# Patient Record
Sex: Male | Born: 2000 | Race: Black or African American | Hispanic: No | Marital: Single | Smoking: Never smoker
Health system: Southern US, Community
[De-identification: ages and names within clinical notes are randomized; demographics above are authoritative.]

## PROBLEM LIST (undated history)

## (undated) DIAGNOSIS — R011 Cardiac murmur, unspecified: Secondary | ICD-10-CM

## (undated) HISTORY — DX: Cardiac murmur, unspecified: R01.1

---
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
## 2020-06-01 ENCOUNTER — Emergency Department: Payer: BC Managed Care – PPO

## 2020-06-01 ENCOUNTER — Encounter: Payer: Self-pay | Admitting: Emergency Medicine

## 2020-06-01 ENCOUNTER — Emergency Department
Admission: EM | Admit: 2020-06-01 | Discharge: 2020-06-01 | Disposition: A | Discharge status: Home / Self Care | Payer: BC Managed Care – PPO | Attending: Emergency Medicine | Admitting: Emergency Medicine

## 2020-06-01 ENCOUNTER — Other Ambulatory Visit: Payer: Self-pay

## 2020-06-01 DIAGNOSIS — R0789 Other chest pain: Secondary | ICD-10-CM | POA: Diagnosis not present

## 2020-06-01 DIAGNOSIS — R002 Palpitations: Secondary | ICD-10-CM | POA: Diagnosis not present

## 2020-06-01 DIAGNOSIS — R079 Chest pain, unspecified: Secondary | ICD-10-CM | POA: Diagnosis present

## 2020-06-01 LAB — BASIC METABOLIC PANEL
Anion gap: 8 (ref 5–15)
BUN: 17 mg/dL (ref 6–20)
CO2: 27 mmol/L (ref 22–32)
Calcium: 9.7 mg/dL (ref 8.9–10.3)
Chloride: 101 mmol/L (ref 98–111)
Creatinine, Ser: 0.93 mg/dL (ref 0.61–1.24)
GFR, Estimated: >60 mL/min (ref >60)
Glucose, Bld: 93 mg/dL (ref 70–99)
Potassium: 4.2 mmol/L (ref 3.5–5.1)
Sodium: 136 mmol/L (ref 135–145)

## 2020-06-01 LAB — CBC
HCT: 50.6 % (ref 39.0–52.0)
Hemoglobin: 17.4 g/dL — ABNORMAL HIGH (ref 13.0–17.0)
MCH: 29.4 pg (ref 26.0–34.0)
MCHC: 34.4 g/dL (ref 30.0–36.0)
MCV: 85.6 fL (ref 80.0–100.0)
Platelets: 264 10*3/uL (ref 150–400)
RBC: 5.91 MIL/uL — ABNORMAL HIGH (ref 4.22–5.81)
RDW: 12.4 % (ref 11.5–15.5)
WBC: 7.6 10*3/uL (ref 4.0–10.5)
nRBC: 0 % (ref 0.0–0.2)

## 2020-06-01 LAB — TROPONIN I (HIGH SENSITIVITY): Troponin I (High Sensitivity): 4 ng/L (ref <18)

## 2020-06-01 NOTE — ED Triage Notes (Addendum)
Pt from home via POV. Pt c/o intermittent central CP (sharp) for 2 weeks with no radiation. Denies N/V, dizziness/SHOB.  Nothing makes it better or worse.   Pt st "my heart beats fast". Denies recent illness.  No cardiac hx. No new medication.  NAD noted by this RN

## 2020-06-01 NOTE — ED Provider Notes (Signed)
[facility] Emergency Department Provider Note   ____________________________________________    I have reviewed the triage vital signs and the nursing notes.   HISTORY  Chief Complaint Chest Pain     HPI [name] is a 19 y.o. male who presents with complaints of chest discomfort and palpitations.  Patient reports he has had intermittent symptoms over the last 2 weeks.  He notes he has a history of a ventricular septal defect and he was to follow-up with cardiology on an annual basis but has not seen them in several years.  His mother did schedule an appointment with them for evaluation.  He reports he has had intermittent discomfort in his chest and occasional sense of palpitations/heart beating faster.  Currently feels well and has no complaints.  No calf pain or swelling.  No pleurisy or shortness of breath.  No fevers or chills.  Denies smoking or substance use   History reviewed. No pertinent past medical history.  There are no problems to display for this patient.   History reviewed. No pertinent surgical history.  Prior to Admission medications   Not on File     Allergies Patient has no known allergies.  History reviewed. No pertinent family history.  Social History Social History   Tobacco Use  . Smoking status: Never Smoker  . Smokeless tobacco: Never Used  Vaping Use  . Vaping Use: Never used  Substance Use Topics  . Alcohol use: No  . Drug use: No    Review of Systems  Constitutional: No fever/chills Eyes: No visual changes.  ENT: No sore throat. Cardiovascular: As above Respiratory: Denies shortness of breath. Gastrointestinal: No abdominal pain.  No nausea, no vomiting.   Genitourinary: Negative for dysuria. Musculoskeletal: Negative for back pain. Skin: Negative for rash. Neurological: Negative for headaches or weakness   ____________________________________________   PHYSICAL EXAM:  VITAL  SIGNS: ED Triage Vitals  Enc Vitals Group     BP 06/01/20 1129 140/78     Pulse Rate 06/01/20 1129 (!) 102     Resp 06/01/20 1129 18     Temp 06/01/20 1129 97.9 F (36.6 C)     Temp Source 06/01/20 1129 Oral     SpO2 06/01/20 1129 100 %     Weight 06/01/20 1130 89.8 kg (198 lb)     Height 06/01/20 1130 1.778 m (5\' 10" )     Head Circumference --      Peak Flow --      Pain Score 06/01/20 1129 0     Pain Loc --      Pain Edu? --      Excl. in GC? --     Constitutional: Alert and oriented. No acute distress. Pleasant and interactive  Nose: No congestion/rhinnorhea. Mouth/Throat: Mucous membranes are moist.    Cardiovascular: Normal rate, regular rhythm.  3 out of 6 systolic murmur, good peripheral circulation. Respiratory: Normal respiratory effort.  No retractions. Lungs CTAB. Gastrointestinal: Soft and nontender. No distention.  No CVA tenderness. Genitourinary: deferred Musculoskeletal: No lower extremity tenderness nor edema.  Warm and well perfused Neurologic:  Normal speech and language. No gross focal neurologic deficits are appreciated.  Skin:  Skin is warm, dry and intact. No rash noted. Psychiatric: Mood and affect are normal. Speech and behavior are normal.  ____________________________________________   LABS (all labs ordered are listed, but only abnormal results are displayed)  Labs Reviewed  CBC - Abnormal; Notable for the following components:  Result Value   RBC 5.91 (*)    Hemoglobin 17.4 (*)    All other components within normal limits  BASIC METABOLIC PANEL  TROPONIN I (HIGH SENSITIVITY)  TROPONIN I (HIGH SENSITIVITY)   ____________________________________________  EKG  ED ECG REPORT I, Jene Every, the attending physician, personally viewed and interpreted this ECG.  Date: 06/01/2020  Rhythm: normal sinus rhythm QRS Axis: normal Intervals: normal ST/T Wave abnormalities: normal Narrative Interpretation: no evidence of acute  ischemia  ____________________________________________  RADIOLOGY  Chest x-ray viewed by me, no infiltrate or effusion ____________________________________________   PROCEDURES  Procedure(s) performed: No  Procedures   Critical Care performed: No ____________________________________________   INITIAL IMPRESSION / ASSESSMENT AND PLAN / ED COURSE  Pertinent labs & imaging results that were available during my care of the patient were reviewed by me and considered in my medical decision making (see chart for details).  Patient presents with palpitations, chest discomfort, asymptomatic at this time.  Heart rate is reassuring, EKG is normal.  Lab work demonstrates normal cardiac enzymes and chemistries.  Mild elevation of hemoglobin noted, no old labs to compare to.  No signs of infection, no evidence of arrhythmia on EKG.  Not consistent with myocarditis given normal troponin.  Given the patient is asymptomatic here in the emergency department and has cardiology follow-up arranged, appropriate for discharge at this time, strict precautions discussed    ____________________________________________   FINAL CLINICAL IMPRESSION(S) / ED DIAGNOSES  Final diagnoses:  Atypical chest pain        Note:  This document was prepared using Dragon voice recognition software and may include unintentional dictation errors.   Jene Every, MD 06/01/20 [phone]

## 2020-07-26 ENCOUNTER — Other Ambulatory Visit: Payer: Self-pay

## 2020-07-26 ENCOUNTER — Emergency Department
Admission: EM | Admit: 2020-07-26 | Discharge: 2020-07-26 | Disposition: A | Discharge status: Home / Self Care | Payer: BC Managed Care – PPO | Attending: Emergency Medicine | Admitting: Emergency Medicine

## 2020-07-26 ENCOUNTER — Emergency Department: Payer: BC Managed Care – PPO

## 2020-07-26 ENCOUNTER — Encounter: Payer: Self-pay | Admitting: Emergency Medicine

## 2020-07-26 DIAGNOSIS — R079 Chest pain, unspecified: Secondary | ICD-10-CM | POA: Diagnosis present

## 2020-07-26 DIAGNOSIS — M940 Chondrocostal junction syndrome [Tietze]: Secondary | ICD-10-CM | POA: Diagnosis not present

## 2020-07-26 DIAGNOSIS — Z20822 Contact with and (suspected) exposure to covid-19: Secondary | ICD-10-CM | POA: Diagnosis not present

## 2020-07-26 LAB — RESP PANEL BY RT-PCR (FLU A&B, COVID) ARPGX2
Influenza A by PCR: NEGATIVE
Influenza B by PCR: NEGATIVE
SARS Coronavirus 2 by RT PCR: NEGATIVE

## 2020-07-26 MED ORDER — MELOXICAM 15 MG PO TABS: 15.0000 mg | ORAL_TABLET | Freq: Every day | ORAL | 0 refills | Status: DC

## 2020-07-26 NOTE — ED Notes (Signed)
Pt states he has intermittent pain in upper chest, states it is not brought on by exertion. Pt states he has some SOB at times, pt in bed at present, NAD. Pt denies cp and SOB at present.

## 2020-07-26 NOTE — ED Provider Notes (Signed)
[facility] Emergency Department Provider Note  ____________________________________________  Time seen: Approximately 5:00 PM  I have reviewed the triage vital signs and the nursing notes.   HISTORY  Chief Complaint Chest Pain and Shortness of Breath    HPI [name] is a 19 y.o. male who presents the emergency department complaining of intermittent left-sided chest pain.  Patient states that the pain began several months ago.  He was seen here at the end of October, on October 29 for similar symptoms.  At that time he had a reassuring work-up with no acute findings.  Patient states that he is having ongoing symptoms that have not worsened nor have they improved.  Patient is primarily concerned as he does have a history  of a VSD and follows with cardiology every 3 months.  Patient has noticed no palpitations, front difficulty of breathing, radiation of pain.  Patient describes it as a intermittent sharp sensation.  He is currently not symptomatic.  Pain is reproduced with palpation along the left sternal border.  Patient states that when he does feel the pain Tylenol typically alleviates his symptoms.        History reviewed. No pertinent past medical history.  There are no problems to display for this patient.   History reviewed. No pertinent surgical history.  Prior to Admission medications   Medication Sig Start Date End Date Taking? Authorizing Provider  meloxicam (MOBIC) 15 MG tablet Take 1 tablet (15 mg total) by mouth daily. 07/26/20   Quantavis Obryant, Delorise Royals, PA-C    Allergies Patient has no known allergies.  No family history on file.  Social History Social History   Tobacco Use   Smoking status: Never Smoker   Smokeless tobacco: Never Used  Building services engineer Use: Never used  Substance Use Topics   Alcohol use: No   Drug use: No     Review of Systems  Constitutional: No fever/chills Eyes: No visual changes. No  discharge ENT: No upper respiratory complaints. Cardiovascular: no chest pain. Respiratory: no cough. No SOB. Gastrointestinal: No abdominal pain.  No nausea, no vomiting.  No diarrhea.  No constipation. Musculoskeletal: Negative for musculoskeletal pain. Skin: Negative for rash, abrasions, lacerations, ecchymosis. Neurological: Negative for headaches, focal weakness or numbness.  10 System ROS otherwise negative.  ____________________________________________   PHYSICAL EXAM:  VITAL SIGNS: ED Triage Vitals  Enc Vitals Group     BP 07/26/20 1350 137/89     Pulse Rate 07/26/20 1350 88     Resp 07/26/20 1350 20     Temp --      Temp src --      SpO2 07/26/20 1350 96 %     Weight 07/26/20 1028 198 lb (89.8 kg)     Height 07/26/20 1028 5\' 10"  (1.778 m)     Head Circumference --      Peak Flow --      Pain Score 07/26/20 1028 2     Pain Loc --      Pain Edu? --      Excl. in GC? --      Constitutional: Alert and oriented. Well appearing and in no acute distress. Eyes: Conjunctivae are normal. PERRL. EOMI. Head: Atraumatic. ENT:      Ears:       Nose: No congestion/rhinnorhea.      Mouth/Throat: Mucous membranes are moist.  Neck: No stridor.    Cardiovascular: Normal rate, regular rhythm. Normal S1 and S2.  Systolic murmur noted throughout auscultation.  Good peripheral circulation. Respiratory: Normal respiratory effort without tachypnea or retractions. Lungs CTAB. Good air entry to the bases with no decreased or absent breath sounds. Musculoskeletal: Full range of motion to all extremities. No gross deformities appreciated. Neurologic:  Normal speech and language. No gross focal neurologic deficits are appreciated.  Skin:  Skin is warm, dry and intact. No rash noted. Psychiatric: Mood and affect are normal. Speech and behavior are normal. Patient exhibits appropriate insight and judgement.   ____________________________________________   LABS (all labs ordered are  listed, but only abnormal results are displayed)  Labs Reviewed  RESP PANEL BY RT-PCR (FLU A&B, COVID) [id number]   ____________________________________________  EKG  ED ECG REPORT I, Delorise Royals Tommy Goostree,  personally viewed and interpreted this ECG.   Date: 07/26/2020  EKG Time: 1023 hrs.  Rate: 76 bpm  Rhythm: unchanged from previous tracings, normal sinus rhythm, incomplete right bundle branch block  Axis: Normal axis  Intervals:Incomplete right bundle branch block  ST&T Change: No ST elevation or depression noted  Normal sinus rhythm.  Incomplete right bundle branch block.  When compared from previous EKG on 06/01/2020 no significant changes.  No STEMI.  ____________________________________________  RADIOLOGY I personally viewed and evaluated these images as part of my medical decision making, as well as reviewing the written report by the radiologist.  ED Provider Interpretation: No acute cardiopulmonary abnormality  DG Chest 2 View  Result Date: 07/26/2020 CLINICAL DATA:  Chest pain and shortness of breath EXAM: CHEST - 2 VIEW COMPARISON:  06/01/2020 FINDINGS: The heart size and mediastinal contours are within normal limits. Both lungs are clear. The visualized skeletal structures are unremarkable. IMPRESSION: No active cardiopulmonary disease. Electronically Signed   By: Alcide Clever M.D.   On: 07/26/2020 10:52    ____________________________________________    PROCEDURES  Procedure(s) performed:    Procedures    Medications - No data to display   ____________________________________________   INITIAL IMPRESSION / ASSESSMENT AND PLAN / ED COURSE  Pertinent labs & imaging results that were available during my care of the patient were reviewed by me and considered in my medical decision making (see chart for details).  Review of the [place] CSRS was performed in accordance of the [facility] prior to dispensing any controlled drugs.           Patient's diagnosis  is consistent with costochondritis.  Patient presented to the emergency department with intermittent sharp left chest wall/chest pain x3 months.  Patient states that he been seen in this department previously, on 1020 04/2020 for similar complaints.  At that time his work-up was reassuring.  Patient has had no change in his symptoms other than it has persisted.  He states that it is very intermittent in nature.  On exam the patient does have reproducible pain along the left sternal border.  He states that the pain reproduced is the pain that he has been feeling.  He does have a systolic heart murmur identified consistent with his known VSD.  Patient is scheduled to see his cardiologist in 2 months.  He sees his cardiologist every 3 months.  At this time, chest x-ray, EKG is reassuring.  I have discussed at length labs including BMP, CBC and troponin as well as further imaging such as CT scan to evaluate the patient's symptoms.  At this time the patient and his mother would prefer treatment for costochondritis and discharge given the length of time he spent in the emergency  department.  At this time even though patient does have a history of VSD I have a low suspicion for pericarditis, endocarditis, PE, STEMI/ACS, dissection.  Patient's symptoms are reproduced with palpation along the left sternal border consistent with costochondritis.  I will place the patient on an anti-inflammatory.  Strict return precautions have been discussed with the patient and his mother for any new or worsening symptoms.  Patient is to follow-up with his cardiologist in 2 months. Patient is given ED precautions to return to the ED for any worsening or new symptoms.     ____________________________________________  FINAL CLINICAL IMPRESSION(S) / ED DIAGNOSES  Final diagnoses:  Costochondritis      NEW MEDICATIONS STARTED DURING THIS VISIT:  ED Discharge Orders         Ordered    meloxicam (MOBIC) 15 MG tablet  Daily         07/26/20 1749              This chart was dictated using voice recognition software/Dragon. Despite best efforts to proofread, errors can occur which can change the meaning. Any change was purely unintentional.    Racheal Patches, PA-C 07/26/20 1749    Phineas Semen, MD 07/26/20 [phone]

## 2020-07-26 NOTE — ED Triage Notes (Signed)
Pt reports intermittent sharp chest pain that started a couple of months ago and SOB that started yesterday. Pt denies all other sx's or recent illnesses.

## 2020-10-28 ENCOUNTER — Encounter: Payer: Self-pay | Admitting: Emergency Medicine

## 2020-10-28 ENCOUNTER — Emergency Department: Payer: BC Managed Care – PPO

## 2020-10-28 ENCOUNTER — Other Ambulatory Visit: Payer: Self-pay

## 2020-10-28 ENCOUNTER — Emergency Department
Admission: EM | Admit: 2020-10-28 | Discharge: 2020-10-28 | Disposition: A | Discharge status: Home / Self Care | Payer: BC Managed Care – PPO | Attending: Emergency Medicine | Admitting: Emergency Medicine

## 2020-10-28 DIAGNOSIS — R42 Dizziness and giddiness: Secondary | ICD-10-CM

## 2020-10-28 DIAGNOSIS — Y92410 Unspecified street and highway as the place of occurrence of the external cause: Secondary | ICD-10-CM | POA: Insufficient documentation

## 2020-10-28 DIAGNOSIS — G44311 Acute post-traumatic headache, intractable: Secondary | ICD-10-CM | POA: Insufficient documentation

## 2020-10-28 DIAGNOSIS — R519 Headache, unspecified: Secondary | ICD-10-CM | POA: Diagnosis present
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
## 2020-12-31 ENCOUNTER — Encounter: Payer: Self-pay | Admitting: Emergency Medicine

## 2020-12-31 ENCOUNTER — Emergency Department
Admission: EM | Admit: 2020-12-31 | Discharge: 2020-12-31 | Disposition: A | Discharge status: Home / Self Care | Payer: No Typology Code available for payment source | Attending: Emergency Medicine | Admitting: Emergency Medicine

## 2020-12-31 DIAGNOSIS — Y92511 Restaurant or cafe as the place of occurrence of the external cause: Secondary | ICD-10-CM | POA: Insufficient documentation

## 2020-12-31 DIAGNOSIS — X118XXA Contact with other hot tap-water, initial encounter: Secondary | ICD-10-CM | POA: Diagnosis not present

## 2020-12-31 DIAGNOSIS — T23001A Burn of unspecified degree of right hand, unspecified site, initial encounter: Secondary | ICD-10-CM | POA: Diagnosis present

## 2020-12-31 DIAGNOSIS — T23201A Burn of second degree of right hand, unspecified site, initial encounter: Secondary | ICD-10-CM | POA: Diagnosis not present

## 2020-12-31 DIAGNOSIS — Y990 Civilian activity done for income or pay: Secondary | ICD-10-CM | POA: Diagnosis not present

## 2020-12-31 DIAGNOSIS — T23261A Burn of second degree of back of right hand, initial encounter: Secondary | ICD-10-CM

## 2020-12-31 MED ORDER — SILVER SULFADIAZINE 1 % EX CREA: TOPICAL_CREAM | CUTANEOUS | 0 refills | Status: DC

## 2020-12-31 MED ORDER — SILVER SULFADIAZINE 1 % EX CREA: TOPICAL_CREAM | Freq: Once | CUTANEOUS | Status: AC

## 2020-12-31 MED ORDER — TRAMADOL HCL 50 MG PO TABS: 50.0000 mg | ORAL_TABLET | Freq: Three times a day (TID) | ORAL | 0 refills | Status: AC | PRN

## 2020-12-31 NOTE — ED Notes (Signed)
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

## 2020-12-31 NOTE — ED Provider Notes (Signed)
[facility] Emergency Department Provider Note ____________________________________________  Time seen: 2121  I have reviewed the triage vital signs and the nursing notes.  HISTORY  Chief Complaint  Hand Burn  HPI [name] is a 20 y.o. male has been sent to the ED for evaluation of a work-related injury.  Patient presents with a burn to the dorsum of his right hand.  Patient works at a [employer]	[place], and got splashed on the hand by some boiling pasta water.   He presents with intact blisters to the dorsum of the right hand with surrounding erythema.  He denies any other injury at this time.  History reviewed. No pertinent past medical history.  There are no problems to display for this patient.   History reviewed. No pertinent surgical history.  Prior to Admission medications   Medication Sig Start Date End Date Taking? Authorizing Provider  silver sulfADIAZINE (SILVADENE) 1 % cream Apply to affected area daily 12/31/20  Yes Jabron Weese, Charlesetta Ivory, PA-C  traMADol (ULTRAM) 50 MG tablet Take 1 tablet (50 mg total) by mouth 3 (three) times daily as needed for up to 5 days. 12/31/20 01/05/21 Yes Josefina Rynders, Charlesetta Ivory, PA-C    Allergies Patient has no known allergies.  History reviewed. No pertinent family history.  Social History Social History   Tobacco Use  . Smoking status: Never Smoker  . Smokeless tobacco: Never Used  Vaping Use  . Vaping Use: Never used  Substance Use Topics  . Alcohol use: No  . Drug use: No    Review of Systems  Constitutional: Negative for fever. Cardiovascular: Negative for chest pain. Respiratory: Negative for shortness of breath. Gastrointestinal: Negative for abdominal pain, vomiting and diarrhea. Genitourinary: Negative for dysuria. Musculoskeletal: Negative for back pain. Skin: Negative for rash.  Right hand burn as above. Neurological: Negative for headaches, focal weakness or  numbness. ____________________________________________  PHYSICAL EXAM:  VITAL SIGNS: ED Triage Vitals  Enc Vitals Group     BP 12/31/20 1952 (!) 156/100     Pulse Rate 12/31/20 1952 91     Resp 12/31/20 1952 18     Temp 12/31/20 1952 98.4 F (36.9 C)     Temp Source 12/31/20 1952 Oral     SpO2 12/31/20 1952 98 %     Weight 12/31/20 1952 210 lb (95.3 kg)     Height --      Head Circumference --      Peak Flow --      Pain Score 12/31/20 2028 8     Pain Loc --      Pain Edu? --      Excl. in GC? --     Constitutional: Alert and oriented. Well appearing and in no distress. Head: Normocephalic and atraumatic. Eyes: Conjunctivae are normal. Normal extraocular movements Cardiovascular: Normal rate, regular rhythm. Normal distal pulses. Respiratory: Normal respiratory effort. Musculoskeletal: Normal composite fist on the right.  Nontender with normal range of motion in all extremities.  Neurologic:  Normal gait without ataxia. Normal speech and language. No gross focal neurologic deficits are appreciated. Skin:  Skin is warm, dry and intact, except for dorsal second-degree burn with intact blisters distally, and a disrupted blister approximately. No rash noted. ____________________________________________  PROCEDURES  Silvadene ointment Wound care  Procedures ____________________________________________   INITIAL IMPRESSION / ASSESSMENT AND PLAN / ED COURSE  As part of my medical decision making, I reviewed the following data within the electronic MEDICAL RECORD NUMBER  Notes from prior ED visits and [place] Controlled Substance Database   Patient ED evaluation management of a work-related injury resulting in a second-degree burn to the dorsum of the right hand.  Patient was evaluated for his complaints, and treated for the second-degree burn with application of Silvadene ointment.  Wound care supplies are provided.  Patient discharged with a prescription for Silvadene ointment as well  as a tramadol prescription for pain relief.  He is return to work with right hand use as tolerated, keeping the wound/dressing clean and dry.  Follow-up with [facility] for ongoing management.  [name] was evaluated in Emergency Department on 12/31/2020 for the symptoms described in the history of present illness. He was evaluated in the context of the global COVID-19 pandemic, which necessitated consideration that the patient might be at risk for infection with the SARS-CoV-2 virus that causes COVID-19. Institutional protocols and algorithms that pertain to the evaluation of patients at risk for COVID-19 are in a state of rapid change based on information released by regulatory bodies including the CDC and federal and state organizations. These policies and algorithms were followed during the patient's care in the ED.  I reviewed the patient's prescription history over the last 12 months in the multi-state controlled substances database(s) that includes [place], [place], [place], [place], [place], [place], [place], [place], [place], [place], [place], [place], [place], and [place].  Results were notable for no RX history. ____________________________________________  FINAL CLINICAL IMPRESSION(S) / ED DIAGNOSES  Final diagnoses:  Partial thickness burn of back of right hand, initial encounter      Lissa Hoard, PA-C 12/31/20 2151    Gilles Chiquito, MD 12/31/20 2314

## 2020-12-31 NOTE — ED Triage Notes (Signed)
Pt at work when another employee spilled boiling water onto his right hand/wrist at approx 1840. Pt has noted blisters and redness to the area. Pain reported by pt in area.

## 2020-12-31 NOTE — Discharge Instructions (Signed)
Apply the antibiotic ointment daily with wound dressing changes.  Keep the wound and dressing clean and dry.  Follow-up with your primary provider or [facility], for further wound care.  Return to the ED if needed.

## 2021-01-04 ENCOUNTER — Encounter: Payer: No Typology Code available for payment source | Attending: Physician Assistant | Admitting: Physician Assistant

## 2021-01-04 ENCOUNTER — Other Ambulatory Visit: Payer: Self-pay

## 2021-01-04 DIAGNOSIS — T23261A Burn of second degree of back of right hand, initial encounter: Secondary | ICD-10-CM | POA: Diagnosis not present

## 2021-01-04 DIAGNOSIS — X12XXXA Contact with other hot fluids, initial encounter: Secondary | ICD-10-CM | POA: Diagnosis not present

## 2021-01-04 DIAGNOSIS — Y939 Activity, unspecified: Secondary | ICD-10-CM | POA: Insufficient documentation

## 2021-01-04 DIAGNOSIS — Y990 Civilian activity done for income or pay: Secondary | ICD-10-CM | POA: Insufficient documentation

## 2021-01-04 DIAGNOSIS — Y92511 Restaurant or cafe as the place of occurrence of the external cause: Secondary | ICD-10-CM | POA: Insufficient documentation

## 2021-01-04 NOTE — Progress Notes (Signed)
[name], [name] ([id number]) Visit Report for 01/04/2021 Abuse/Suicide Risk Screen Details Patient Name: [name], [name] Date of Service: 01/04/2021 2:15 PM Medical Record Number: [id number] Patient Account Number: [id number] Date of Birth/Sex: [birth year] (20 y.o. M) Treating RN: Rogers Blocker Primary Care Truth Barot: Marcos Eke Other Clinician: Referring Amrita Radu: Antoine Primas Treating Rozell Kettlewell/Extender: Rowan Blase in Treatment: 0 Abuse/Suicide Risk Screen Items Answer ABUSE RISK SCREEN: Has anyone close to you tried to hurt or harm you recentlyo No Do you feel uncomfortable with anyone in your familyo No Has anyone forced you do things that you didnot want to doo No Electronic Signature(s) Signed: 01/04/2021 4:18:56 PM By: Phillis Haggis, Dondra Prader RN Entered By: Phillis Haggis, Dondra Prader on 01/04/2021 14:40:47 [name], William D. ([id number]) -------------------------------------------------------------------------------- Activities of Daily Living Details Patient Name: [name], William D. Date of Service: 01/04/2021 2:15 PM Medical Record Number: [id number] Patient Account Number: [id number] Date of Birth/Sex: 16-Mar-2001 (20 y.o. M) Treating RN: Rogers Blocker Primary Care Kelci Petrella: Marcos Eke Other Clinician: Referring Yarissa Reining: Antoine Primas Treating Eloy Fehl/Extender: Rowan Blase in Treatment: 0 Activities of Daily Living Items Answer Activities of Daily Living (Please select one for each item) Drive Automobile Completely Able Take Medications Completely Able Use Telephone Completely Able Care for Appearance Completely Able Use Toilet Completely Able Bath / Shower Completely Able Dress Self Completely Able Feed Self Completely Able Walk Completely Able Get In / Out Bed Completely Able Housework Completely Able Prepare Meals Completely Able Handle Money Completely Able Shop for Self Completely Able Electronic Signature(s) Signed:  01/04/2021 4:18:56 PM By: Phillis Haggis, Dondra Prader RN Entered By: Phillis Haggis, Dondra Prader on 01/04/2021 14:41:04 [name], [name] ([id number]) -------------------------------------------------------------------------------- Education Screening Details Patient Name: William Arnold D. Date of Service: 01/04/2021 2:15 PM Medical Record Number: [id number] Patient Account Number: [id number] Date of Birth/Sex: 25-Mar-2001 (20 y.o. M) Treating RN: Rogers Blocker Primary Care Skylen Danielsen: Marcos Eke Other Clinician: Referring Tatym Schermer: Antoine Primas Treating Anthea Udovich/Extender: Rowan Blase in Treatment: 0 Primary Learner Assessed: Patient Learning Preferences/Education Level/Primary Language Learning Preference: Explanation, Demonstration Highest Education Level: High School Preferred Language: English Cognitive Barrier Language Barrier: No Translator Needed: No Memory Deficit: No Emotional Barrier: No Cultural/Religious Beliefs Affecting Medical Care: No Physical Barrier Impaired Vision: No Impaired Hearing: No Decreased Hand dexterity: No Knowledge/Comprehension Knowledge Level: High Comprehension Level: High Ability to understand written instructions: High Ability to understand verbal instructions: High Motivation Anxiety Level: Calm Cooperation: Cooperative Education Importance: Acknowledges Need Interest in Health Problems: Asks Questions Perception: Coherent Willingness to Engage in Self-Management High Activities: Readiness to Engage in Self-Management High Activities: Electronic Signature(s) Signed: 01/04/2021 4:18:56 PM By: Phillis Haggis, Dondra Prader RN Entered By: Phillis Haggis, Dondra Prader on 01/04/2021 14:41:29 [name], [name] ([id number]) -------------------------------------------------------------------------------- Fall Risk Assessment Details Patient Name: William Arnold D. Date of Service: 01/04/2021 2:15 PM Medical Record Number:  [id number] Patient Account Number: [id number] Date of Birth/Sex: 2001-03-08 (20 y.o. M) Treating RN: Rogers Blocker Primary Care Blakeley Scheier: DOWNS, Jeannett Senior Other Clinician: Referring Dianara Smullen: Antoine Primas Treating Erza Mothershead/Extender: Rowan Blase in Treatment: 0 Fall Risk Assessment Items Have you had 2 or more falls in the last 12 monthso 0 No Have you had any fall that resulted in injury in the last 12 monthso 0 No FALLS RISK SCREEN History of falling - immediate or within 3 months 0 No Secondary diagnosis (Do you have 2 or more medical diagnoseso) 0 No Ambulatory aid None/bed rest/wheelchair/nurse 0 No Crutches/cane/walker 0 No Furniture 0 No Intravenous therapy Access/Saline/Heparin Lock 0 No Gait/Transferring Normal/ bed rest/ wheelchair  0 Yes Weak (short steps with or without shuffle, stooped but able to lift head while walking, may 0 No seek support from furniture) Impaired (short steps with shuffle, may have difficulty arising from chair, head down, impaired 0 No balance) Mental Status Oriented to own ability 0 Yes Electronic Signature(s) Signed: 01/04/2021 4:18:56 PM By: Phillis Haggis, Dondra Prader RN Entered By: Phillis Haggis, Kenia on 01/04/2021 14:41:43 [name], William D. ([id number]) -------------------------------------------------------------------------------- Foot Assessment Details Patient Name: [name], William D. Date of Service: 01/04/2021 2:15 PM Medical Record Number: [id number] Patient Account Number: [id number] Date of Birth/Sex: 2001/05/24 (20 y.o. M) Treating RN: Rogers Blocker Primary Care Dori Devino: DOWNS, Jeannett Senior Other Clinician: Referring Necha Harries: Antoine Primas Treating Kemiyah Tarazon/Extender: Rowan Blase in Treatment: 0 Foot Assessment Items Site Locations + = Sensation present, - = Sensation absent, C = Callus, U = Ulcer R = Redness, W = Warmth, M = Maceration, PU = Pre-ulcerative lesion F = Fissure, S = Swelling, D =  Dryness Assessment Right: Left: Other Deformity: No No Prior Foot Ulcer: No No Prior Amputation: No No Charcot Joint: No No Ambulatory Status: Ambulatory Without Help Gait: Steady Electronic Signature(s) Signed: 01/04/2021 4:18:56 PM By: Phillis Haggis, Dondra Prader RN Entered By: Phillis Haggis, Dondra Prader on 01/04/2021 14:41:57 [name], William D. ([id number]) -------------------------------------------------------------------------------- Nutrition Risk Screening Details Patient Name: [name], William D. Date of Service: 01/04/2021 2:15 PM Medical Record Number: [id number] Patient Account Number: [id number] Date of Birth/Sex: 2000/08/15 (20 y.o. M) Treating RN: Rogers Blocker Primary Care Jackelin Correia: Marcos Eke Other Clinician: Referring Khaliel Morey: Antoine Primas Treating Camillo Quadros/Extender: Allen Derry Weeks in Treatment: 0 Height (in): 70 Weight (lbs): 218 Body Mass Index (BMI): 31.3 Nutrition Risk Screening Items Score Screening NUTRITION RISK SCREEN: I have an illness or condition that made me change the kind and/or amount of food I eat 0 No I eat fewer than two meals per day 0 No I eat few fruits and vegetables, or milk products 0 No I have three or more drinks of beer, liquor or wine almost every day 0 No I have tooth or mouth problems that make it hard for me to eat 0 No I don't always have enough money to buy the food I need 0 No I eat alone most of the time 0 No I take three or more different prescribed or over-the-counter drugs a day 0 No Without wanting to, I have lost or gained 10 pounds in the last six months 0 No I am not always physically able to shop, cook and/or feed myself 0 No Nutrition Protocols Good Risk Protocol 0 No interventions needed Moderate Risk Protocol High Risk Proctocol Risk Level: Good Risk Score: 0 Electronic Signature(s) Signed: 01/04/2021 4:18:56 PM By: Phillis Haggis, Dondra Prader RN Entered By: Phillis Haggis, Dondra Prader on 01/04/2021 14:41:50

## 2021-01-08 NOTE — Progress Notes (Signed)
[name], William D. ([id number]) Visit Report for 01/04/2021 Chief Complaint Document Details Patient Name: [name], William D. Date of Service: 01/04/2021 2:15 PM Medical Record Number: [id number] Patient Account Number: [id number] Date of Birth/Sex: 05/08/2001 (20 y.o. M) Treating RN: [name] Primary Care Provider: Marcos EkeWNS, [place] Other Clinician: Referring Provider: Antoine PrimasSmith, [place] Treating Provider/Extender: [name]: 0 Information Obtained from: Patient Chief Complaint Second Degree burn right hand Electronic Signature(s) Signed: 01/04/2021 3:13:19 PM By: William KelpStone III, Lyric Hoar PA-C Entered By: William KelpStone III, Ilda Laskin on 01/04/2021 15:13:19 [name] Kitchen. ([id number]) -------------------------------------------------------------------------------- HPI Details Patient Name: [name], William D. Date of Service: 01/04/2021 2:15 PM Medical Record Number: [id number] Patient Account Number: [id number] Date of Birth/Sex: 02/09/2001 (20 y.o. M) Treating RN: [name] Primary Care Provider: Marcos EkeWNS, [place] Other Clinician: Referring Provider: Antoine PrimasSmith, [place] Treating Provider/Extender: William BlaseStone, Domingue Coltrain Weeks in [place]: 0 History of Present Illness HPI Description: 01/04/2021 upon evaluation today patient presents for initial inspection here in our clinic concerning a burn which is second- degree over his right dorsal hand which occurred this past Monday, 4 days ago, around 5:30 in the evening. 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 Hot water boiled over and splashed on his hand. He has not really been able to work since that time. With that being said he does not show any signs of active infection at this time which is great news has been using Silvadene which is what the ER recommended when he was seen there. He tells me that does help it feel somewhat better. Fortunately there does not  appear to be any signs of infection right now the Silvadene should help in this regard. Electronic Signature(s) Signed: 01/04/2021 5:22:12 PM By: William KelpStone III, Theodoros Stjames PA-C Entered By: William KelpStone III, Garo Heidelberg on 01/04/2021 17:22:12 [name], [name] ([id number]) -------------------------------------------------------------------------------- Physical Exam Details Patient Name: [name], William D. Date of Service: 01/04/2021 2:15 PM Medical Record Number: [id number] Patient Account Number: [id number] Date of Birth/Sex: 04/10/2001 (20 y.o. M) Treating RN: [name] Primary Care Provider: Marcos EkeWNS, [place] Other Clinician: Referring Provider: Antoine PrimasSmith, [place] Treating Provider/Extender: William BlaseStone, Tawsha Terrero Weeks in [place]: 0 Constitutional sitting or standing blood pressure is within target range for patient.. pulse regular and within target range for patient.[place] Kitchen. respirations regular, non- labored and within target range for patient.[place] Kitchen. temperature within target range for patient.. Well-nourished and well-hydrated in no acute distress. Eyes conjunctiva clear no eyelid edema noted. pupils equal round and reactive to light and accommodation. Ears, Nose, Mouth, and Throat no gross abnormality of ear auricles or external auditory canals. normal hearing noted during conversation. mucus membranes moist. Respiratory normal breathing without difficulty. Musculoskeletal normal gait and posture. no significant deformity or arthritic changes, no loss or range of motion, no clubbing. Psychiatric this patient is able to make decisions and demonstrates good insight into disease process. Alert and Oriented x 3. pleasant and cooperative. Notes Upon inspection patient's wound bed actually showed signs of a fairly superficial burn this is definitely a second-degree burn however as it does seem that the tissue blistered and I did clear away some of the tissue by way of mechanical debridement today which was not healthy  and necrotic on the surface. He tolerated that without significant pain although he did have some discomfort this is good news. Overall I think that he is doing quite well and I really feel like he is in a good spot to see this heal quite  effectively and quickly. With the appropriate therapy. Electronic Signature(s) Signed: 01/04/2021 5:22:46 PM By: William Kelp PA-C Entered By: William Kelp on 01/04/2021 17:22:45 [name], [name] ([id number]) -------------------------------------------------------------------------------- Physician Orders Details Patient Name: William Arnold D. Date of Service: 01/04/2021 2:15 PM Medical Record Number: [id number] Patient Account Number: [id number] Date of Birth/Sex: 04-Nov-2000 (20 y.o. M) Treating RN: William Pax Primary Care Provider: DOWNS, Jeannett [place] Other Clinician: Referring Provider: Antoine Primas Treating Provider/Extender: William Blase in [place]: 0 Verbal / Phone Orders: No Diagnosis Coding ICD-10 Coding Code Description T23.261A Burn of second degree of back of right hand, initial encounter Follow-up Appointments o Return Appointment in 1 week. Wound [place] Wound #1 - Hand - Dorsum Wound Laterality: Right Cleanser: Soap and Water 2 x Per Day/30 Days Discharge Instructions: Gently cleanse wound with antibacterial soap, rinse and pat dry prior to dressing wounds Primary Dressing: silvadene 2 x Per Day/30 Days Discharge Instructions: apply thick layer to wound bed Secondary Dressing: ABD Pad 5x9 (in/in) 2 x Per Day/30 Days Discharge Instructions: Cover with ABD pad Secondary Dressing: Kerlix 4.5 x 4.1 (in/yd) 2 x Per Day/30 Days Discharge Instructions: Apply Kerlix 4.5 x 4.1 (in/yd) as instructed Secured With: 27M Medipore H Soft Cloth Surgical Tape, 2x2 (in/yd) 2 x Per Day/30 Days Electronic Signature(s) Signed: 01/04/2021 5:30:36 PM By: William Kelp PA-C Signed: 01/08/2021 8:01:20 AM By: William Pax RN Entered By:  William Pax on 01/04/2021 15:44:09 Lablanc, Wynn D. ([id number]) -------------------------------------------------------------------------------- Problem List Details Patient Name: [name], William D. Date of Service: 01/04/2021 2:15 PM Medical Record Number: [id number] Patient Account Number: [id number] Date of Birth/Sex: 2000-08-12 (20 y.o. M) Treating RN: William Pax Primary Care Provider: Marcos Eke Other Clinician: Referring Provider: Antoine Primas Treating Provider/Extender: William Blase in [place]: 0 Active Problems ICD-10 Encounter Code Description Active Date MDM Diagnosis T23.261A Burn of second degree of back of right hand, initial encounter 01/04/2021 No Yes Inactive Problems Resolved Problems Electronic Signature(s) Signed: 01/04/2021 3:12:46 PM By: William Kelp PA-C Entered By: William Kelp on 01/04/2021 15:12:46 [name], William D. ([id number]) -------------------------------------------------------------------------------- Progress Note Details Patient Name: [name], William D. Date of Service: 01/04/2021 2:15 PM Medical Record Number: [id number] Patient Account Number: [id number] Date of Birth/Sex: [birth year] (20 y.o. M) Treating RN: William Pax Primary Care Provider: Marcos Eke Other Clinician: Referring Provider: Antoine Primas Treating Provider/Extender: William Blase in [place]: 0 Subjective Chief Complaint Information obtained from Patient Second Degree burn right hand History of Present Illness (HPI) 01/04/2021 upon evaluation today patient presents for initial inspection here in our clinic concerning a burn which is second-degree over his right dorsal hand which occurred this past Monday, 4 days ago, around 5:30 in the evening. 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 Hot water boiled over and splashed on his hand. He has not really been  able to work since that time. With that being said he does not show any signs of active infection at this time which is great news has been using Silvadene which is what the ER recommended when he was seen there. He tells me that does help it feel somewhat better. Fortunately there does not appear to be any signs of infection right now the Silvadene should help in this regard. Patient History Allergies No Known Drug Allergies Social History Never smoker, Marital Status - Single, Alcohol Use - Never, Drug Use - No History, Caffeine Use -  Rarely. Medical History Eyes Denies history of Cataracts, Glaucoma, Optic Neuritis Ear/Nose/Mouth/Throat Denies history of Chronic sinus problems/congestion, Middle ear problems Hematologic/Lymphatic Denies history of Anemia, Hemophilia, Human Immunodeficiency Virus, Lymphedema, Sickle Cell Disease Respiratory Denies history of Aspiration, Asthma, Chronic Obstructive Pulmonary Disease (COPD), Pneumothorax, Sleep Apnea, Tuberculosis Cardiovascular Denies history of Angina, Arrhythmia, Congestive Heart Failure, Coronary Artery Disease, Deep Vein Thrombosis, Hypertension, Hypotension, Myocardial Infarction, Peripheral Arterial Disease, Peripheral Venous Disease, Phlebitis, Vasculitis Gastrointestinal Denies history of Cirrhosis , Colitis, Crohn s, Hepatitis A, Hepatitis B, Hepatitis C Endocrine Denies history of Type I Diabetes, Type II Diabetes Genitourinary Denies history of End Stage Renal Disease Immunological Denies history of Lupus Erythematosus, Raynaud s, Scleroderma Integumentary (Skin) Denies history of History of Burn, History of pressure wounds Musculoskeletal Denies history of Gout, Rheumatoid Arthritis, Osteoarthritis, Osteomyelitis Neurologic Denies history of Dementia, Quadriplegia, Paraplegia, Seizure Disorder Oncologic Denies history of Received Chemotherapy, Received Radiation Psychiatric Denies history of Anorexia/bulimia,  Confinement Anxiety Medical And Surgical History Notes Cardiovascular Heart murmur Review of Systems (ROS) Constitutional Symptoms (General Health) Denies complaints or symptoms of Fatigue, Fever, Chills, Marked Weight Change. Eyes Denies complaints or symptoms of Dry Eyes, Vision Changes, Glasses / Contacts. Ear/Nose/Mouth/Throat Denies complaints or symptoms of Difficult clearing ears, Sinusitis. Hematologic/Lymphatic Denies complaints or symptoms of Bleeding / Clotting Disorders, Human Immunodeficiency Virus. [name], William D. ([id number]) Respiratory Denies complaints or symptoms of Chronic or frequent coughs, Shortness of Breath. Cardiovascular Denies complaints or symptoms of Chest pain, LE edema. Gastrointestinal Denies complaints or symptoms of Frequent diarrhea, Nausea, Vomiting. Endocrine Denies complaints or symptoms of Hepatitis, Thyroid disease, Polydypsia (Excessive Thirst). Genitourinary Denies complaints or symptoms of Kidney failure/ Dialysis, Incontinence/dribbling. Immunological Denies complaints or symptoms of Hives, Itching. Integumentary (Skin) Denies complaints or symptoms of Wounds, Bleeding or bruising tendency, Breakdown, Swelling. Musculoskeletal Denies complaints or symptoms of Muscle Pain, Muscle Weakness. Neurologic Denies complaints or symptoms of Numbness/parasthesias, Focal/Weakness. Psychiatric Denies complaints or symptoms of Anxiety, Claustrophobia. Objective Constitutional sitting or standing blood pressure is within target range for patient.. pulse regular and within target range for patient.[place] Kitchen respirations regular, non- labored and within target range for patient.[place] Kitchen temperature within target range for patient.. Well-nourished and well-hydrated in no acute distress. Vitals Time Taken: 2:35 PM, Height: 70 in, Source: Stated, Weight: 218 lbs, Source: Measured, BMI: 31.3, Temperature: 98.2 F, Pulse: 109 bpm, Respiratory Rate: 16  breaths/min, Blood Pressure: 129/79 mmHg. Eyes conjunctiva clear no eyelid edema noted. pupils equal round and reactive to light and accommodation. Ears, Nose, Mouth, and Throat no gross abnormality of ear auricles or external auditory canals. normal hearing noted during conversation. mucus membranes moist. Respiratory normal breathing without difficulty. Musculoskeletal normal gait and posture. no significant deformity or arthritic changes, no loss or range of motion, no clubbing. Psychiatric this patient is able to make decisions and demonstrates good insight into disease process. Alert and Oriented x 3. pleasant and cooperative. General Notes: Upon inspection patient's wound bed actually showed signs of a fairly superficial burn this is definitely a second-degree burn however as it does seem that the tissue blistered and I did clear away some of the tissue by way of mechanical debridement today which was not healthy and necrotic on the surface. He tolerated that without significant pain although he did have some discomfort this is good news. Overall I think that he is doing quite well and I really feel like he is in a good spot to see this heal quite effectively and quickly. With the appropriate therapy. Integumentary (Hair,  Skin) Wound #1 status is Open. Original cause of wound was Thermal Burn. The date acquired was: 12/31/2020. The wound is located on the Right Hand - Dorsum. The wound measures 4.5cm length x 3.2cm width x 0.1cm depth; 11.31cm^2 area and 1.131cm^3 volume. There is no tunneling or undermining noted. There is a medium amount of sanguinous drainage noted. There is large (67-100%) pink granulation within the wound bed. There is no necrotic tissue within the wound bed. Assessment Active Problems ICD-10 Burn of second degree of back of right hand, initial encounter [name], [name] ([id number]) Plan Follow-up Appointments: Return Appointment in 1 week. WOUND #1: -  Hand - Dorsum Wound Laterality: Right Cleanser: Soap and Water 2 x Per Day/30 Days Discharge Instructions: Gently cleanse wound with antibacterial soap, rinse and pat dry prior to dressing wounds Primary Dressing: silvadene 2 x Per Day/30 Days Discharge Instructions: apply thick layer to wound bed Secondary Dressing: ABD Pad 5x9 (in/in) 2 x Per Day/30 Days Discharge Instructions: Cover with ABD pad Secondary Dressing: Kerlix 4.5 x 4.1 (in/yd) 2 x Per Day/30 Days Discharge Instructions: Apply Kerlix 4.5 x 4.1 (in/yd) as instructed Secured With: 9M Medipore H Soft Cloth Surgical Tape, 2x2 (in/yd) 2 x Per Day/30 Days 1. Would recommend currently that the patient should stay out of work for the time being at least to the next week we will wait and see how things are going and how he is feeling. 2. I would recommend currently we use Silvadene cream 2 times per day to the wound to help keep this clean and protected. 3. He will wrap this with gauze or an ABD pad followed by Kerlix. We will see patient back for reevaluation in 1 week here in the clinic. If anything worsens or changes patient will contact our office for additional recommendations. Electronic Signature(s) Signed: 01/04/2021 5:23:11 PM By: William Kelp PA-C Entered By: William Kelp on 01/04/2021 17:23:10 [name], [name] ([id number]) -------------------------------------------------------------------------------- ROS/PFSH Details Patient Name: William Arnold D. Date of Service: 01/04/2021 2:15 PM Medical Record Number: [id number] Patient Account Number: [id number] Date of Birth/Sex: 12-03-2000 (20 y.o. M) Treating RN: Rogers Blocker Primary Care Provider: Marcos Eke Other Clinician: Referring Provider: Antoine Primas Treating Provider/Extender: William Blase in [place]: 0 Constitutional Symptoms (General Health) Complaints and Symptoms: Negative for: Fatigue; Fever; Chills; Marked Weight  Change Eyes Complaints and Symptoms: Negative for: Dry Eyes; Vision Changes; Glasses / Contacts Medical History: Negative for: Cataracts; Glaucoma; Optic Neuritis Ear/Nose/Mouth/Throat Complaints and Symptoms: Negative for: Difficult clearing ears; Sinusitis Medical History: Negative for: Chronic sinus problems/congestion; Middle ear problems Hematologic/Lymphatic Complaints and Symptoms: Negative for: Bleeding / Clotting Disorders; Human Immunodeficiency Virus Medical History: Negative for: Anemia; Hemophilia; Human Immunodeficiency Virus; Lymphedema; Sickle Cell Disease Respiratory Complaints and Symptoms: Negative for: Chronic or frequent coughs; Shortness of Breath Medical History: Negative for: Aspiration; Asthma; Chronic Obstructive Pulmonary Disease (COPD); Pneumothorax; Sleep Apnea; Tuberculosis Cardiovascular Complaints and Symptoms: Negative for: Chest pain; LE edema Medical History: Negative for: Angina; Arrhythmia; Congestive Heart Failure; Coronary Artery Disease; Deep Vein Thrombosis; Hypertension; Hypotension; Myocardial Infarction; Peripheral Arterial Disease; Peripheral Venous Disease; Phlebitis; Vasculitis Past Medical History Notes: Heart murmur Gastrointestinal Complaints and Symptoms: Negative for: Frequent diarrhea; Nausea; Vomiting Medical History: Negative for: Cirrhosis ; Colitis; Crohnos; Hepatitis A; Hepatitis B; Hepatitis C Endocrine Complaints and Symptoms: Negative for: Hepatitis; Thyroid disease; Polydypsia (Excessive Thirst) Medical History: [name], [name] ([id number]) Negative for: Type I Diabetes; Type II Diabetes Genitourinary Complaints and Symptoms: Negative for: Kidney failure/ Dialysis;  Incontinence/dribbling Medical History: Negative for: End Stage Renal Disease Immunological Complaints and Symptoms: Negative for: Hives; Itching Medical History: Negative for: Lupus Erythematosus; Raynaudos; Scleroderma Integumentary  (Skin) Complaints and Symptoms: Negative for: Wounds; Bleeding or bruising tendency; Breakdown; Swelling Medical History: Negative for: History of Burn; History of pressure wounds Musculoskeletal Complaints and Symptoms: Negative for: Muscle Pain; Muscle Weakness Medical History: Negative for: Gout; Rheumatoid Arthritis; Osteoarthritis; Osteomyelitis Neurologic Complaints and Symptoms: Negative for: Numbness/parasthesias; Focal/Weakness Medical History: Negative for: Dementia; Quadriplegia; Paraplegia; Seizure Disorder Psychiatric Complaints and Symptoms: Negative for: Anxiety; Claustrophobia Medical History: Negative for: Anorexia/bulimia; Confinement Anxiety Oncologic Medical History: Negative for: Received Chemotherapy; Received Radiation Immunizations Pneumococcal Vaccine: Received Pneumococcal Vaccination: No Implantable Devices None Family and Social History Never smoker; Marital Status - Single; Alcohol Use: Never; Drug Use: No History; Caffeine Use: Rarely Electronic Signature(s) Signed: 01/04/2021 4:18:56 PM By: Phillis Haggis, Dondra Prader RN [name], William D. ([id number]) Signed: 01/04/2021 5:30:36 PM By: William Kelp PA-C Entered By: Phillis Haggis, Dondra Prader on 01/04/2021 14:40:42 Lamos, [name] ([id number]) -------------------------------------------------------------------------------- SuperBill Details Patient Name: William Arnold D. Date of Service: 01/04/2021 Medical Record Number: [id number] Patient Account Number: [id number] Date of Birth/Sex: 07/03/2001 (20 y.o. M) Treating RN: William Pax Primary Care Provider: DOWNS, Jeannett [place] Other Clinician: Referring Provider: Antoine Primas Treating Provider/Extender: William Blase in [place]: 0 Diagnosis Coding ICD-10 Codes Code Description T23.261A Burn of second degree of back of right hand, initial encounter Facility Procedures CPT4 Code: [id number] Description: 99214 - WOUND CARE VISIT-LEV 4  EST PT Modifier: Quantity: 1 Physician Procedures CPT4 Code: [id number] Description: WC PHYS LEVEL 3 o NEW PT Modifier: Quantity: 1 CPT4 Code: Description: ICD-10 Diagnosis Description T23.261A Burn of second degree of back of right hand, initial encounter Modifier: Quantity: Electronic Signature(s) Signed: 01/04/2021 5:23:22 PM By: William Kelp PA-C Entered By: William Kelp on 01/04/2021 17:23:22

## 2021-01-08 NOTE — Progress Notes (Signed)
ZAYAAN, KOZAK ([id number]) Visit Report for 01/04/2021 Allergy List Details Patient Name: [name], [name] Date of Service: 01/04/2021 2:15 PM Medical Record Number: [id number] Patient Account Number: [id number] Date of Birth/Sex: [birth year] (20 y.o. M) Treating RN: Rogers Blocker Primary Care Henya Aguallo: Marcos Eke Other Clinician: Referring Damar Petit: Antoine Primas Treating Zamantha Strebel/Extender: Allen Derry Weeks in Treatment: 0 Allergies Active Allergies No Known Drug Allergies Allergy Notes Electronic Signature(s) Signed: 01/04/2021 4:18:56 PM By: Phillis Haggis, Dondra Prader RN Entered By: Phillis Haggis, Dondra Prader on 01/04/2021 14:38:01 Karis, Nile Dear ([id number]) -------------------------------------------------------------------------------- Arrival Information Details Patient Name: William Arnold D. Date of Service: 01/04/2021 2:15 PM Medical Record Number: [id number] Patient Account Number: [id number] Date of Birth/Sex: 2000-08-12 (20 y.o. M) Treating RN: Rogers Blocker Primary Care Stanislaw Acton: Marcos Eke Other Clinician: Referring Keiffer Piper: Antoine Primas Treating Jaryn Rosko/Extender: Rowan Blase in Treatment: 0 Visit Information Patient Arrived: Ambulatory Arrival Time: 14:35 Accompanied By: self Transfer Assistance: None Patient Identification Verified: Yes Secondary Verification Process Completed: Yes Patient Has Alerts: Yes Patient Alerts: NOT DIABETIC Electronic Signature(s) Signed: 01/04/2021 4:18:56 PM By: Phillis Haggis, Dondra Prader RN Entered By: Phillis Haggis, Dondra Prader on 01/04/2021 14:50:44 Carlson, Nile Dear ([id number]) -------------------------------------------------------------------------------- Clinic Level of Care Assessment Details Patient Name: William Arnold D. Date of Service: 01/04/2021 2:15 PM Medical Record Number: [id number] Patient Account Number: [id number] Date of Birth/Sex: October 20, 2000 (20 y.o. M) Treating RN: Yevonne Pax Primary Care Eusebia Grulke: DOWNS, Jeannett Senior Other Clinician: Referring Santita Hunsberger: Antoine Primas Treating Hadassa Cermak/Extender: Rowan Blase in Treatment: 0 Clinic Level of Care Assessment Items TOOL 2 Quantity Score X - Use when only an EandM is performed on the INITIAL visit 1 0 ASSESSMENTS - Nursing Assessment / Reassessment X - General Physical Exam (combine w/ comprehensive assessment (listed just below) when performed on new 1 20 pt. evals) X- 1 25 Comprehensive Assessment (HX, ROS, Risk Assessments, Wounds Hx, etc.) ASSESSMENTS - Wound and Skin Assessment / Reassessment X - Simple Wound Assessment / Reassessment - one wound 1 5 []  - 0 Complex Wound Assessment / Reassessment - multiple wounds []  - 0 Dermatologic / Skin Assessment (not related to wound area) ASSESSMENTS - Ostomy and/or Continence Assessment and Care []  - Incontinence Assessment and Management 0 []  - 0 Ostomy Care Assessment and Management (repouching, etc.) PROCESS - Coordination of Care X - Simple Patient / Family Education for ongoing care 1 15 []  - 0 Complex (extensive) Patient / Family Education for ongoing care []  - 0 Staff obtains , Records, Test Results / Process Orders []  - 0 Staff telephones HHA, Nursing Homes / Clarify orders / etc []  - 0 Routine Transfer to another Facility (non-emergent condition) []  - 0 Routine Hospital Admission (non-emergent condition) []  - 0 New Admissions / / Ordering NPWT, Apligraf, etc. []  - 0 Emergency Hospital Admission (emergent condition) X- 1 10 Simple Discharge Coordination []  - 0 Complex (extensive) Discharge Coordination PROCESS - Special Needs []  - Pediatric / Minor Patient Management 0 []  - 0 Isolation Patient Management []  - 0 Hearing / Language / Visual special needs []  - 0 Assessment of Community assistance (transportation, D/C planning, etc.) []  - 0 Additional assistance / Altered mentation []  - 0 Support  Surface(s) Assessment (bed, cushion, seat, etc.) INTERVENTIONS - Wound Cleansing / Measurement X - Wound Imaging (photographs - any number of wounds) 1 5 []  - 0 Wound Tracing (instead of photographs) X- 1 5 Simple Wound Measurement - one wound []  - 0 Complex Wound Measurement - multiple wounds Minichiello, Kayvion D. ( ) []  -  0 Simple Wound Cleansing - one wound X- 1 5 Complex Wound Cleansing - multiple wounds INTERVENTIONS - Wound Dressings []  - Small Wound Dressing one or multiple wounds 0 X- 1 15 Medium Wound Dressing one or multiple wounds []  - 0 Large Wound Dressing one or multiple wounds []  - 0 Application of Medications - injection INTERVENTIONS - Miscellaneous []  - External ear exam 0 []  - 0 Specimen Collection (cultures, biopsies, blood, body fluids, etc.) []  - 0 Specimen(s) / Culture(s) sent or taken to Lab for analysis []  - 0 Patient Transfer (multiple staff / / Similar devices) []  - 0 Simple Staple / Suture removal (25 or less) []  - 0 Complex Staple / Suture removal (26 or more) []  - 0 Hypo / Hyperglycemic Management (close monitor of Blood Glucose) X- 1 15 Ankle / Brachial Index (ABI) - do not check if billed separately Has the patient been seen at the hospital within the last three years: Yes Total Score: 120 Level Of Care: New/Established - Level 4 Electronic Signature(s) Signed: 01/08/2021 8:01:20 AM By: RN Entered By: on 01/04/2021 15:44:45 Cid, ( ) -------------------------------------------------------------------------------- Encounter Discharge Information Details Patient Name: [name] D. Date of Service: 01/04/2021 2:15 PM Medical Record Number: Patient Account Number: Date of Birth/Sex: Apr 19, 2001 (20 y.o. M) Treating RN: Yevonne Pax Primary Care Michiko Lineman: Yevonne Pax Other Clinician: Referring Neill Jurewicz: 03/06/2021 Treating  Burrell Hodapp/Extender: Nile Dear in Treatment: 0 Encounter Discharge Information Items Discharge Condition: Stable Ambulatory Status: Ambulatory Discharge Destination: Home Transportation: Private Auto Accompanied By: self Schedule Follow-up Appointment: Yes Clinical Summary of Care: Electronic Signature(s) Signed: 01/04/2021 4:41:52 PM By: William Arnold Entered By: 03/06/2021 on 01/04/2021 15:54:59 Ferrucci, Roshard D. ([id number]) -------------------------------------------------------------------------------- Lower Extremity Assessment Details Patient Name: Gronewold, Ainsley D. Date of Service: 01/04/2021 2:15 PM Medical Record Number: Hansel Feinstein Patient Account Number: Marcos Eke Date of Birth/Sex: 07-13-2001 (20 y.o. M) Treating RN: Rowan Blase Primary Care Pelham Hennick: 03/06/2021 Other Clinician: Referring Paton Crum: Hansel Feinstein Treating Reylynn Vanalstine/Extender: Hansel Feinstein Weeks in Treatment: 0 Electronic Signature(s) Signed: 01/04/2021 4:18:56 PM By: [id number], 03/06/2021 RN Entered By: [id number], [id number] on 01/04/2021 14:48:03 Higuchi, Rogers Blocker (Marcos Eke) -------------------------------------------------------------------------------- Multi Wound Chart Details Patient Name: Antoine Primas D. Date of Service: 01/04/2021 2:15 PM Medical Record Number: 03/06/2021 Patient Account Number: Phillis Haggis Date of Birth/Sex: 2001-02-13 (20 y.o. M) Treating RN: Phillis Haggis Primary Care Liona Wengert: Dondra Prader Other Clinician: Referring Sarely Stracener: 03/06/2021 Treating Miyoshi Ligas/Extender: Nile Dear in Treatment: 0 Vital Signs Height(in): 70 Pulse(bpm): 109 Weight(lbs): 218 Blood Pressure(mmHg): 129/79 Body Mass Index(BMI): 31 Temperature(F): 98.2 Respiratory Rate(breaths/min): 16 Photos: [N/A:N/A] Wound Location: Hand - Dorsum N/A N/A Wounding Event: Thermal Burn N/A N/A Primary Etiology: 2nd degree Burn N/A N/A Date Acquired: 12/31/2020 N/A  N/A Weeks of Treatment: 0 N/A N/A Wound Status: Open N/A N/A Measurements L x W x D (cm) 4.5x3.2x0.1 N/A N/A Area (cm) : 11.31 N/A N/A Volume (cm) : 1.131 N/A N/A Classification: Partial Thickness N/A N/A Exudate Amount: Medium N/A N/A Exudate Type: Sanguinous N/A N/A Exudate Color: red N/A N/A Granulation Amount: Large (67-100%) N/A N/A Granulation Quality: Pink N/A N/A Necrotic Amount: None Present (0%) N/A N/A Exposed Structures: Fascia: No N/A N/A Fat Layer (Subcutaneous Tissue): No Tendon: No Muscle: No Joint: No Bone: No Epithelialization: Small (1-33%) N/A N/A Treatment Notes Electronic Signature(s) Signed: 01/08/2021 8:01:20 AM By: 03/06/2021 RN Entered By: [id number] on 01/04/2021 15:36:43 Farney, Noha D5/12/2000 (Yevonne Pax) -------------------------------------------------------------------------------- Multi-Disciplinary Care  Plan Details Patient Name: Silvestre MomentMERRITT, Aviv D. Date of Service: 01/04/2021 2:15 PM Medical Record Number: [id number] Patient Account Number: [id number] Date of Birth/Sex: 10/05/2000 (20 y.o. M) Treating RN: Yevonne PaxEpps, Carrie Primary Care Jaziah Kwasnik: DOWNS, Jeannett SeniorSTEPHEN Other Clinician: Referring Mishal Probert: Antoine PrimasSmith, Zachary Treating Fitz Matsuo/Extender: Rowan BlaseStone, Hoyt Weeks in Treatment: 0 Active Inactive Wound/Skin Impairment Nursing Diagnoses: Knowledge deficit related to ulceration/compromised skin integrity Goals: Patient/caregiver will verbalize understanding of skin care regimen Date Initiated: 01/04/2021 Target Resolution Date: 02/03/2021 Goal Status: Active Ulcer/skin breakdown will have a volume reduction of 30% by week 4 Date Initiated: 01/04/2021 Target Resolution Date: 02/03/2021 Goal Status: Active Ulcer/skin breakdown will have a volume reduction of 50% by week 8 Date Initiated: 01/04/2021 Target Resolution Date: 03/06/2021 Goal Status: Active Ulcer/skin breakdown will have a volume reduction of 80% by week 12 Date Initiated: 01/04/2021 Target  Resolution Date: 04/06/2021 Goal Status: Active Ulcer/skin breakdown will heal within 14 weeks Date Initiated: 01/04/2021 Target Resolution Date: 05/06/2021 Goal Status: Active Interventions: Assess patient/caregiver ability to obtain necessary supplies Assess patient/caregiver ability to perform ulcer/skin care regimen upon admission and as needed Assess ulceration(s) every visit Notes: Electronic Signature(s) Signed: 01/08/2021 8:01:20 AM By: Yevonne PaxEpps, Carrie RN Entered By: Yevonne PaxEpps, Carrie on 01/04/2021 15:36:27 Rideout, Nabor D. ([id number]) -------------------------------------------------------------------------------- Pain Assessment Details Patient Name: William ArnoldMERRITT, Jai D. Date of Service: 01/04/2021 2:15 PM Medical Record Number: [id number] Patient Account Number: [id number] Date of Birth/Sex: 04/07/2001 (20 y.o. M) Treating RN: Rogers BlockerSanchez, Kenia Primary Care Neizan Debruhl: Marcos EkeWNS, STEPHEN Other Clinician: Referring Annalynne Ibanez: Antoine PrimasSmith, Zachary Treating Kathrene Sinopoli/Extender: Rowan BlaseStone, Hoyt Weeks in Treatment: 0 Active Problems Location of Pain Severity and Description of Pain Patient Has Paino Yes Site Locations Pain Location: Pain in Ulcers Rate the pain. Current Pain Level: 4 Character of Pain Describe the Pain: Throbbing Pain Management and Medication Current Pain Management: Electronic Signature(s) Signed: 01/04/2021 4:18:56 PM By: Phillis HaggisSanchez Pereyda, Dondra PraderKenia RN Entered By: Phillis HaggisSanchez Pereyda, Dondra PraderKenia on 01/04/2021 14:35:47 Caillier, Nile DearHRISTOPHER D. ([id number]) -------------------------------------------------------------------------------- Patient/Caregiver Education Details Patient Name: William ArnoldMERRITT, Kaedyn D. Date of Service: 01/04/2021 2:15 PM Medical Record Number: [id number] Patient Account Number: [id number] Date of Birth/Gender: 07/23/2001 (20 y.o. M) Treating RN: Yevonne PaxEpps, Carrie Primary Care Physician: DOWNS, Jeannett SeniorSTEPHEN Other Clinician: Referring Physician: Antoine PrimasSmith, Zachary Treating  Physician/Extender: Rowan BlaseStone, Hoyt Weeks in Treatment: 0 Education Assessment Education Provided To: Patient Education Topics Provided Wound/Skin Impairment: Methods: Explain/Verbal Responses: State content correctly Electronic Signature(s) Signed: 01/08/2021 8:01:20 AM By: Yevonne PaxEpps, Carrie RN Entered By: Yevonne PaxEpps, Carrie on 01/04/2021 15:45:03 Clites, Tenzin D. ([id number]) -------------------------------------------------------------------------------- Wound Assessment Details Patient Name: Gullickson, Harvy D. Date of Service: 01/04/2021 2:15 PM Medical Record Number: [id number] Patient Account Number: [id number] Date of Birth/Sex: 01/10/2001 (20 y.o. M) Treating RN: Rogers BlockerSanchez, Kenia Primary Care Bari Leib: Marcos EkeWNS, STEPHEN Other Clinician: Referring Ziah Turvey: Antoine PrimasSmith, Zachary Treating Kura Bethards/Extender: Allen DerryStone, Hoyt Weeks in Treatment: 0 Wound Status Wound Number: 1 Primary Etiology: 2nd degree Burn Wound Location: Hand - Dorsum Wound Status: Open Wounding Event: Thermal Burn Date Acquired: 12/31/2020 Weeks Of Treatment: 0 Clustered Wound: No Photos Wound Measurements Length: (cm) 4.5 Width: (cm) 3.2 Depth: (cm) 0.1 Area: (cm) 11.31 Volume: (cm) 1.131 % Reduction in Area: % Reduction in Volume: Epithelialization: Small (1-33%) Tunneling: No Undermining: No Wound Description Classification: Partial Thickness Exudate Amount: Medium Exudate Type: Sanguinous Exudate Color: red Foul Odor After Cleansing: No Slough/Fibrino No Wound Bed Granulation Amount: Large (67-100%) Exposed Structure Granulation Quality: Pink Fascia Exposed: No Necrotic Amount: None Present (0%) Fat Layer (Subcutaneous Tissue) Exposed: No Tendon Exposed: No Muscle Exposed: No Joint Exposed: No Bone Exposed: No  Electronic Signature(s) Signed: 01/04/2021 4:18:56 PM By: Phillis Haggis, Dondra Prader RN Entered By: Phillis Haggis, Dondra Prader on 01/04/2021 14:47:52 Agent, Nile Dear  ([id number]) -------------------------------------------------------------------------------- Vitals Details Patient Name: William Arnold D. Date of Service: 01/04/2021 2:15 PM Medical Record Number: [id number] Patient Account Number: [id number] Date of Birth/Sex: 10-12-2000 (20 y.o. M) Treating RN: Rogers Blocker Primary Care Natalin Bible: DOWNS, Jeannett Senior Other Clinician: Referring Krystie Leiter: Antoine Primas Treating Marlette Curvin/Extender: Rowan Blase in Treatment: 0 Vital Signs Time Taken: 14:35 Temperature (F): 98.2 Height (in): 70 Pulse (bpm): 109 Source: Stated Respiratory Rate (breaths/min): 16 Weight (lbs): 218 Blood Pressure (mmHg): 129/79 Source: Measured Reference Range: 80 - 120 mg / dl Body Mass Index (BMI): 31.3 Electronic Signature(s) Signed: 01/04/2021 4:18:56 PM By: Phillis Haggis, Dondra Prader RN Entered By: Phillis Haggis, Dondra Prader on 01/04/2021 14:37:39

## 2021-01-11 ENCOUNTER — Other Ambulatory Visit: Payer: Self-pay

## 2021-01-11 ENCOUNTER — Encounter: Payer: No Typology Code available for payment source | Admitting: Physician Assistant

## 2021-01-11 DIAGNOSIS — T23261A Burn of second degree of back of right hand, initial encounter: Secondary | ICD-10-CM | POA: Diagnosis not present

## 2021-01-11 NOTE — Progress Notes (Addendum)
GRAFTON, WARZECHA ([id number]) Visit Report for 01/11/2021 Arrival Information Details Patient Name: [name], [name] Date of Service: 01/11/2021 11:00 AM Medical Record Number: [id number] Patient Account Number: [id number] Date of Birth/Sex: 12-24-2000 (20 y.o. M) Treating Whitehead: [name] Primary Care Myeisha Kruser: [name] Other Clinician: Referring [name]: [name] Treating [name]/Extender: [name] in Treatment: 1 Visit Information History Since Last Visit Added or deleted any medications: No Patient Arrived: Ambulatory Had a fall or experienced change in No Arrival Time: 11:23 activities of daily living that may affect Accompanied By: self risk of falls: Transfer Assistance: None Hospitalized since last visit: No Patient Identification Verified: Yes Has Dressing in Place as Prescribed: No Secondary Verification Process Completed: Yes Pain Present Now: Yes Patient Has Alerts: Yes Patient Alerts: NOT DIABETIC Electronic Signature(s) Signed: 01/11/2021 1:59:38 PM By: [name] Entered By: [name] on 01/11/2021 11:25:24 Whitehead, William D. ([id number]) -------------------------------------------------------------------------------- Clinic Level of Care Assessment Details Patient Name: [name] D. Date of Service: 01/11/2021 11:00 AM Medical Record Number: [id number] Patient Account Number: [id number] Date of Birth/Sex: 2000-08-22 (20 y.o. M) Treating Whitehead: William Pax Primary Care Adin Laker: [name] Other Clinician: Referring Dalyah Pla: [name] Treating [name]/Extender: [name] in Treatment: 1 Clinic Level of Care Assessment Items TOOL 4 Quantity Score X - Use when only an EandM is performed on FOLLOW-UP visit 1 0 ASSESSMENTS - Nursing Assessment / Reassessment X - Reassessment of Co-morbidities (includes updates in patient status) 1 10 X- 1 5 Reassessment of Adherence to Treatment Plan ASSESSMENTS -  Wound and Skin Assessment / Reassessment X - Simple Wound Assessment / Reassessment - one wound 1 5 []  - 0 Complex Wound Assessment / Reassessment - multiple wounds []  - 0 Dermatologic / Skin Assessment (not related to wound area) ASSESSMENTS - Focused Assessment []  - Circumferential Edema Measurements - multi extremities 0 []  - 0 Nutritional Assessment / Counseling / Intervention []  - 0 Lower Extremity Assessment (monofilament, tuning fork, pulses) []  - 0 Peripheral Arterial Disease Assessment (using hand held doppler) ASSESSMENTS - Ostomy and/or Continence Assessment and Care []  - Incontinence Assessment and Management 0 []  - 0 Ostomy Care Assessment and Management (repouching, etc.) PROCESS - Coordination of Care X - Simple Patient / Family Education for ongoing care 1 15 []  - 0 Complex (extensive) Patient / Family Education for ongoing care []  - 0 Staff obtains , Records, Test Results / Process Orders []  - 0 Staff telephones HHA, Nursing Homes / Clarify orders / etc []  - 0 Routine Transfer to another Facility (non-emergent condition) []  - 0 Routine Hospital Admission (non-emergent condition) []  - 0 New Admissions / / Ordering NPWT, Apligraf, etc. []  - 0 Emergency Hospital Admission (emergent condition) X- 1 10 Simple Discharge Coordination []  - 0 Complex (extensive) Discharge Coordination PROCESS - Special Needs []  - Pediatric / Minor Patient Management 0 []  - 0 Isolation Patient Management []  - 0 Hearing / Language / Visual special needs []  - 0 Assessment of Community assistance (transportation, D/C planning, etc.) []  - 0 Additional assistance / Altered mentation []  - 0 Support Surface(s) Assessment (bed, cushion, seat, etc.) INTERVENTIONS - Wound Cleansing / Measurement Whitehead, William D. ( ) X- 1 5 Simple Wound Cleansing - one wound []  - 0 Complex Wound Cleansing - multiple wounds X- 1 5 Wound Imaging  (photographs - any number of wounds) []  - 0 Wound Tracing (instead of photographs) X- 1 5 Simple Wound Measurement - one wound []  - 0 Complex Wound Measurement -  multiple wounds INTERVENTIONS - Wound Dressings []  - Small Wound Dressing one or multiple wounds 0 X- 1 15 Medium Wound Dressing one or multiple wounds []  - 0 Large Wound Dressing one or multiple wounds []  - 0 Application of Medications - topical []  - 0 Application of Medications - injection INTERVENTIONS - Miscellaneous []  - External ear exam 0 []  - 0 Specimen Collection (cultures, biopsies, blood, body fluids, etc.) []  - 0 Specimen(s) / Culture(s) sent or taken to Lab for analysis []  - 0 Patient Transfer (multiple staff / Nurse, adultHoyer Lift / Similar devices) []  - 0 Simple Staple / Suture removal (25 or less) []  - 0 Complex Staple / Suture removal (26 or more) []  - 0 Hypo / Hyperglycemic Management (close monitor of Blood Glucose) []  - 0 Ankle / Brachial Index (ABI) - do not check if billed separately X- 1 5 Vital Signs Has the patient been seen at the hospital within the last three years: Yes Total Score: 80 Level Of Care: New/Established - Level 3 Electronic Signature(s) Signed: 01/11/2021 4:40:55 PM By: [name] Entered By: William PaxEpps, Carrie on 01/11/2021 11:48:20 Humphrey, [name] ([id number]) -------------------------------------------------------------------------------- Encounter Discharge Information Details Patient Name: William ArnoldMERRITT, William D. Date of Service: 01/11/2021 11:00 AM Medical Record Number: [id number] Patient Account Number: [id number] Date of Birth/Sex: 01/29/2001 (20 y.o. M) Treating Whitehead: William FeinsteinBishop, William Primary Care [name]: William EkeWNS, William Other Clinician: Referring [name]: [name] Treating Anysha Frappier/Extender: William BlaseStone, [name] in Treatment: 1 Encounter Discharge Information Items Discharge Condition: Stable Ambulatory Status: Ambulatory Discharge Destination:  Home Transportation: Private Auto Accompanied By: self Schedule Follow-up Appointment: Yes Clinical Summary of Care: Electronic Signature(s) Signed: 01/11/2021 1:59:38 PM By: William FeinsteinBishop, William Entered By: William FeinsteinBishop, William on 01/11/2021 12:03:07 [name], William D. ([id number]) -------------------------------------------------------------------------------- Lower Extremity Assessment Details Patient Name: [name], William D. Date of Service: 01/11/2021 11:00 AM Medical Record Number: [id number] Patient Account Number: [id number] Date of Birth/Sex: 03/28/2001 (20 y.o. M) Treating Whitehead: William FeinsteinBishop, William Primary Care Rebekka Lobello: William EkeWNS, William Other Clinician: Referring Liann Spaeth: [name] Treating Abri Vacca/Extender: William BlaseStone, [name] in Treatment: 1 Electronic Signature(s) Signed: 01/11/2021 1:59:38 PM By: William FeinsteinBishop, William Entered By: William FeinsteinBishop, William on 01/11/2021 11:29:25 Futrell, Jahkari D. ([id number]) -------------------------------------------------------------------------------- Multi Wound Chart Details Patient Name: [name], William D. Date of Service: 01/11/2021 11:00 AM Medical Record Number: [id number] Patient Account Number: [id number] Date of Birth/Sex: 01/25/2001 (20 y.o. M) Treating Whitehead: William PaxEpps, Carrie Primary Care Oswin Griffith: Whitehead, Jeannett SeniorSTEPHEN Other Clinician: Referring Alexiya Franqui: [name] Treating Aylissa Heinemann/Extender: William BlaseStone, [name] in Treatment: 1 Vital Signs Height(in): 70 Pulse(bpm): 90 Weight(lbs): 218 Blood Pressure(mmHg): 146/86 Body Mass Index(BMI): 31 Temperature(F): 98.7 Respiratory Rate(breaths/min): 16 Photos: [N/A:N/A] Wound Location: Right Hand - Dorsum N/A N/A Wounding Event: Thermal Burn N/A N/A Primary Etiology: 2nd degree Burn N/A N/A Date Acquired: 12/31/2020 N/A N/A Whitehead of Treatment: 1 N/A N/A Wound Status: Open N/A N/A Measurements L x W x D (cm) 3x1.5x0.1 N/A N/A Area (cm) : 3.534 N/A N/A Volume (cm) : 0.353 N/A N/A % Reduction in Area: 68.80%  N/A N/A % Reduction in Volume: 68.80% N/A N/A Classification: Partial Thickness N/A N/A Exudate Amount: Medium N/A N/A Exudate Type: Serosanguineous N/A N/A Exudate Color: red, brown N/A N/A Granulation Amount: None Present (0%) N/A N/A Necrotic Amount: Large (67-100%) N/A N/A Necrotic Tissue: Eschar N/A N/A Exposed Structures: Fascia: No N/A N/A Fat Layer (Subcutaneous Tissue): No Tendon: No Muscle: No Joint: No Bone: No Epithelialization: Medium (34-66%) N/A N/A Treatment Notes Electronic Signature(s) Signed: 01/11/2021 4:40:55 PM By: [name] Entered By:  William Pax on 01/11/2021 11:46:55 Shukla, MIROSLAV GIN ([id number]) -------------------------------------------------------------------------------- Multi-Disciplinary Care Plan Details Patient Name: [name], [name] Date of Service: 01/11/2021 11:00 AM Medical Record Number: [id number] Patient Account Number: [id number] Date of Birth/Sex: November 18, 2000 (20 y.o. M) Treating Whitehead: William Pax Primary Care Kynisha Memon: Whitehead, Jeannett Senior Other Clinician: Referring Britanee Vanblarcom: [name] Treating Laylamarie Meuser/Extender: [name] in Treatment: 1 Active Inactive Wound/Skin Impairment Nursing Diagnoses: Knowledge deficit related to ulceration/compromised skin integrity Goals: Patient/caregiver will verbalize understanding of skin care regimen Date Initiated: 01/04/2021 Target Resolution Date: 02/03/2021 Goal Status: Active Ulcer/skin breakdown will have a volume reduction of 30% by week 4 Date Initiated: 01/04/2021 Target Resolution Date: 02/03/2021 Goal Status: Active Ulcer/skin breakdown will have a volume reduction of 50% by week 8 Date Initiated: 01/04/2021 Target Resolution Date: 03/06/2021 Goal Status: Active Ulcer/skin breakdown will have a volume reduction of 80% by week 12 Date Initiated: 01/04/2021 Target Resolution Date: 04/06/2021 Goal Status: Active Ulcer/skin breakdown will heal within 14 Whitehead Date  Initiated: 01/04/2021 Target Resolution Date: 05/06/2021 Goal Status: Active Interventions: Assess patient/caregiver ability to obtain necessary supplies Assess patient/caregiver ability to perform ulcer/skin care regimen upon admission and as needed Assess ulceration(s) every visit Notes: Electronic Signature(s) Signed: 01/11/2021 4:40:55 PM By: William Pax Whitehead Entered By: William Pax on 01/11/2021 11:46:39 [name], William D. ([id number]) -------------------------------------------------------------------------------- Pain Assessment Details Patient Name: [name] D. Date of Service: 01/11/2021 11:00 AM Medical Record Number: [id number] Patient Account Number: [id number] Date of Birth/Sex: September 06, 2000 (20 y.o. M) Treating Whitehead: [name] Primary Care Ziad Maye: [name] Other Clinician: Referring Mohammed Mcandrew: [name] Treating Arslan Kier/Extender: [name] in Treatment: 1 Active Problems Location of Pain Severity and Description of Pain Patient Has Paino Yes Site Locations Pain Location: Pain in Ulcers Rate the pain. Current Pain Level: 2 Pain Management and Medication Current Pain Management: Electronic Signature(s) Signed: 01/11/2021 1:59:38 PM By: [name] Entered By: [name] on 01/11/2021 11:25:54 [name], William D. ([id number]) -------------------------------------------------------------------------------- Patient/Caregiver Education Details Patient Name: [name] D. Date of Service: 01/11/2021 11:00 AM Medical Record Number: [id number] Patient Account Number: [id number] Date of Birth/Gender: Sep 08, 2000 (20 y.o. M) Treating Whitehead: William Pax Primary Care Physician: Whitehead, Jeannett Senior Other Clinician: Referring Physician: DOWNS, William Treating Physician/Extender: [name] in Treatment: 1 Education Assessment Education Provided To: Patient Education Topics Provided Wound/Skin Impairment: Methods:  Explain/Verbal Responses: State content correctly Electronic Signature(s) Signed: 01/11/2021 4:40:55 PM By: William Pax Whitehead Entered By: William Pax on 01/11/2021 11:48:48 [name], William D. ([id number]) -------------------------------------------------------------------------------- Wound Assessment Details Patient Name: [name], William D. Date of Service: 01/11/2021 11:00 AM Medical Record Number: [id number] Patient Account Number: [id number] Date of Birth/Sex: [birth year] (20 y.o. M) Treating Whitehead: [name] Primary Care Shmuel Girgis: [name] Other Clinician: Referring Geselle Cardosa: [name] Treating Morrell Fluke/Extender: [name] in Treatment: 1 Wound Status Wound Number: 1 Primary Etiology: 2nd degree Burn Wound Location: Right Hand - Dorsum Wound Status: Open Wounding Event: Thermal Burn Date Acquired: 12/31/2020 Whitehead Of Treatment: 1 Clustered Wound: No Photos Wound Measurements Length: (cm) 3 Width: (cm) 1.5 Depth: (cm) 0.1 Area: (cm) 3.534 Volume: (cm) 0.353 % Reduction in Area: 68.8% % Reduction in Volume: 68.8% Epithelialization: Medium (34-66%) Tunneling: No Undermining: No Wound Description Classification: Partial Thickness Exudate Amount: Medium Exudate Type: Serosanguineous Exudate Color: red, brown Foul Odor After Cleansing: No Slough/Fibrino Yes Wound Bed Granulation Amount: None Present (0%) Exposed Structure Necrotic Amount: Large (67-100%) Fascia Exposed: No Necrotic Quality: Eschar Fat Layer (Subcutaneous Tissue) Exposed: No Tendon Exposed: No Muscle Exposed: No  Joint Exposed: No Bone Exposed: No Treatment Notes Wound #1 (Hand - Dorsum) Wound Laterality: Right Cleanser Soap and Water Discharge Instruction: Gently cleanse wound with antibacterial soap, rinse and pat dry prior to dressing wounds Peri-Wound Care [name], William COLT. ([id number]) Topical Primary Dressing silvadene Discharge Instruction: apply thick  layer to wound bed Secondary Dressing ABD Pad 5x9 (in/in) Discharge Instruction: Cover with ABD pad Kerlix 4.5 x 4.1 (in/yd) Discharge Instruction: Apply Kerlix 4.5 x 4.1 (in/yd) as instructed Secured With 41M Medipore H Soft Cloth Surgical Tape, 2x2 (in/yd) Compression Wrap Compression Stockings Add-Ons Electronic Signature(s) Signed: 01/11/2021 1:59:38 PM By: [name] Entered By: [name] on 01/11/2021 11:27:54 [name], [name] D. ([id number]) -------------------------------------------------------------------------------- Vitals Details Patient Name: [name] D. Date of Service: 01/11/2021 11:00 AM Medical Record Number: [id number] Patient Account Number: [id number] Date of Birth/Sex: 12-23-2000 (20 y.o. M) Treating Whitehead: [name] Primary Care Zian Delair: [name] Other Clinician: Referring Leyla Soliz: [name] Treating Ian Castagna/Extender: [name] in Treatment: 1 Vital Signs Time Taken: 11:22 Temperature (F): 98.7 Height (in): 70 Pulse (bpm): 90 Weight (lbs): 218 Respiratory Rate (breaths/min): 16 Body Mass Index (BMI): 31.3 Blood Pressure (mmHg): 146/86 Reference Range: 80 - 120 mg / dl Electronic Signature(s) Signed: 01/11/2021 1:59:38 PM By: [name] Entered ByHansel Whitehead on 01/11/2021 11:25:44
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
## 2021-01-18 ENCOUNTER — Encounter: Payer: No Typology Code available for payment source | Admitting: Physician Assistant

## 2021-01-18 ENCOUNTER — Other Ambulatory Visit: Payer: Self-pay

## 2021-01-18 DIAGNOSIS — T23261A Burn of second degree of back of right hand, initial encounter: Secondary | ICD-10-CM | POA: Diagnosis not present
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
## 2021-01-25 ENCOUNTER — Ambulatory Visit: Payer: BC Managed Care – PPO | Admitting: Physician Assistant

## 2022-03-06 ENCOUNTER — Ambulatory Visit (INDEPENDENT_AMBULATORY_CARE_PROVIDER_SITE_OTHER): Payer: BC Managed Care – PPO | Admitting: Family Medicine

## 2022-03-06 ENCOUNTER — Encounter: Payer: Self-pay | Admitting: Family Medicine

## 2022-03-06 VITALS — BP 130/84 | HR 81 | Temp 98.2°F | Ht 70.0 in | Wt 230.2 lb

## 2022-03-06 DIAGNOSIS — Q210 Ventricular septal defect: Secondary | ICD-10-CM

## 2022-03-06 DIAGNOSIS — R030 Elevated blood-pressure reading, without diagnosis of hypertension: Secondary | ICD-10-CM

## 2022-03-06 DIAGNOSIS — Z0000 Encounter for general adult medical examination without abnormal findings: Secondary | ICD-10-CM

## 2022-03-06 DIAGNOSIS — G4730 Sleep apnea, unspecified: Secondary | ICD-10-CM | POA: Diagnosis not present

## 2022-03-06 NOTE — Progress Notes (Signed)
    SUBJECTIVE:   CHIEF COMPLAINT / HPI: establish care  Patient presents to clinic to establish care with new PCP  No acute concerns  Patient reports that his girlfriend has noticed he stops breathing at night for a few seconds and is concerned.  He endorses loud snoring and increased fatigue during the day.  Has had recent weight gain. Trying to work on eating healthy.  No increase in exercise.  PERTINENT  PMH / PSH:  VSD  FmHx Dad- HTN  SHx No Tobacco use Etoh socially THC intermittent use No cocaine use   OBJECTIVE:   BP 130/84 (BP Location: Left Arm, Patient Position: Sitting, Cuff Size: Normal)   Pulse 81   Temp 98.2 F (36.8 C) (Oral)   Ht 5\' 10"  (1.778 m)   Wt 230 lb 3.2 oz (104.4 kg)   SpO2 98%   BMI 33.03 kg/m    General: Alert, no acute distress Cardio: Normal S1 and S2, RRR, 3-4/5 holosystolic murmur mid lower sternal border Pulm: CTAB, normal work of breathing Abdomen: Bowel sounds normal. Abdomen soft and non-tender.  Extremities: No peripheral edema.  Neuro: Cranial nerves grossly intact   ASSESSMENT/PLAN:   Elevated blood pressure reading Initial BP elevated.  Repeated and was normotensive.  Asymptomatic. -Encouraged healthy lifestyle changes -Monitor at each visit  VSD (ventricular septal defect) Chronic. Stable.  Recent ECHO from 2022 reviewed showing small VSD.  Followed by [facility]. Currently asymptomatic. -Follow up with Cardiology in 3 years -If becomes symptomatic follow up sooner  Sleep apnea in adult Stable. STOP Bang score high risk for OSA -Refer to Pulmonology for evaluation and sleep studies.   Healthcare maintenance HIV/ Hep C screening to be readdressed at next visit   PDMP reviewed  [facility], MD

## 2022-03-06 NOTE — Patient Instructions (Signed)
It was a pleasure meeting you today. Thank you for allowing me to take part in your health care.  Our goals for today as we discussed include:  For your sleep apnea I sent a referral to pulmonology to evaluate for sleep studies. Recommend weight loss to help improve symptoms  For your blood pressure Your repeat blood pressure was 134/80.   Weight loss can decrease your blood pressure by 5%. Decrease processed foods and limit salt intake. Will continue to monitor your blood pressure at your next visit.   Please follow-up with PCP in 6 months  If you have any questions or concerns, please do not hesitate to call the office at [phone].  I look forward to our next visit and until then take care and stay safe.  Regards,   Dana Allan, MD   [place]   Sleep Apnea Sleep apnea affects breathing during sleep. It causes breathing to stop for 10 seconds or more, or to become shallow. People with sleep apnea usually snore loudly. It can also increase the risk of: Heart attack. Stroke. Being very overweight (obese). Diabetes. Heart failure. Irregular heartbeat. High blood pressure. The goal of treatment is to help you breathe normally again. What are the causes?  The most common cause of this condition is a collapsed or blocked airway. There are three kinds of sleep apnea: Obstructive sleep apnea. This is caused by a blocked or collapsed airway. Central sleep apnea. This happens when the brain does not send the right signals to the muscles that control breathing. Mixed sleep apnea. This is a combination of obstructive and central sleep apnea. What increases the risk? Being overweight. Smoking. Having a small airway. Being older. Being male. Drinking alcohol. Taking medicines to calm yourself (sedatives or tranquilizers). Having family members with the condition. Having a tongue or tonsils that are larger than normal. What are the signs or  symptoms? Trouble staying asleep. Loud snoring. Headaches in the morning. Waking up gasping. Dry mouth or sore throat in the morning. Being sleepy or tired during the day. If you are sleepy or tired during the day, you may also: Not be able to focus your mind (concentrate). Forget things. Get angry a lot and have mood swings. Feel sad (depressed). Have changes in your personality. Have less interest in sex, if you are male. Be unable to have an erection, if you are male. How is this treated?  Sleeping on your side. Using a medicine to get rid of mucus in your nose (decongestant). Avoiding the use of alcohol, medicines to help you relax, or certain pain medicines (narcotics). Losing weight, if needed. Changing your diet. Quitting smoking. Using a machine to open your airway while you sleep, such as: An oral appliance. This is a mouthpiece that shifts your lower jaw forward. A CPAP device. This device blows air through a mask when you breathe out (exhale). An EPAP device. This has valves that you put in each nostril. A BIPAP device. This device blows air through a mask when you breathe in (inhale) and breathe out. Having surgery if other treatments do not work. Follow these instructions at home: Lifestyle Make changes that your doctor recommends. Eat a healthy diet. Lose weight if needed. Avoid alcohol, medicines to help you relax, and some pain medicines. Do not smoke or use any products that contain nicotine or tobacco. If you need help quitting, ask your doctor. General instructions Take over-the-counter and prescription medicines only as told by your doctor.  If you were given a machine to use while you sleep, use it only as told by your doctor. If you are having surgery, make sure to tell your doctor you have sleep apnea. You may need to bring your device with you. Keep all follow-up visits. Contact a doctor if: The machine that you were given to use during sleep bothers  you or does not seem to be working. You do not get better. You get worse. Get help right away if: Your chest hurts. You have trouble breathing in enough air. You have an uncomfortable feeling in your back, arms, or stomach. You have trouble talking. One side of your body feels weak. A part of your face is hanging down. These symptoms may be an emergency. Get help right away. Call your local emergency services (911 in the [place] Do not wait to see if the symptoms will go away. Do not drive yourself to the hospital. Summary This condition affects breathing during sleep. The most common cause is a collapsed or blocked airway. The goal of treatment is to help you breathe normally while you sleep. This information is not intended to replace advice given to you by your health care provider. Make sure you discuss any questions you have with your health care provider. Document Revised: 02/27/2021 Document Reviewed: 06/29/2020 Elsevier Patient Education  2023 [employer].

## 2022-03-16 ENCOUNTER — Encounter: Payer: Self-pay | Admitting: Family Medicine

## 2022-03-16 DIAGNOSIS — R030 Elevated blood-pressure reading, without diagnosis of hypertension: Secondary | ICD-10-CM | POA: Insufficient documentation

## 2022-03-16 DIAGNOSIS — G4730 Sleep apnea, unspecified: Secondary | ICD-10-CM | POA: Insufficient documentation

## 2022-03-16 DIAGNOSIS — Q210 Ventricular septal defect: Secondary | ICD-10-CM | POA: Insufficient documentation

## 2022-03-16 DIAGNOSIS — Z0000 Encounter for general adult medical examination without abnormal findings: Secondary | ICD-10-CM | POA: Insufficient documentation

## 2022-03-16 NOTE — Assessment & Plan Note (Addendum)
Stable. STOP Bang score high risk for OSA -Refer to Pulmonology for evaluation and sleep studies.

## 2022-03-16 NOTE — Assessment & Plan Note (Signed)
Initial BP elevated.  Repeated and was normotensive.  Asymptomatic. -Encouraged healthy lifestyle changes -Monitor at each visit

## 2022-03-16 NOTE — Assessment & Plan Note (Addendum)
Chronic. Stable.  Recent ECHO from 2022 reviewed showing small VSD.  Followed by [facility]. Currently asymptomatic. -Follow up with Cardiology in 3 years -If becomes symptomatic follow up sooner

## 2022-03-16 NOTE — Assessment & Plan Note (Addendum)
HIV/ Hep C screening to be readdressed at next visit

## 2022-09-09 ENCOUNTER — Ambulatory Visit: Payer: BC Managed Care – PPO | Admitting: Family Medicine

## 2022-09-09 IMAGING — CR DG CHEST 2V
2 series · 2 of 2 positions shown · non-contrast
Comparison: 06/01/2020

CLINICAL DATA: Chest pain and shortness of breath

EXAM:
CHEST - 2 VIEW

[chest pa]
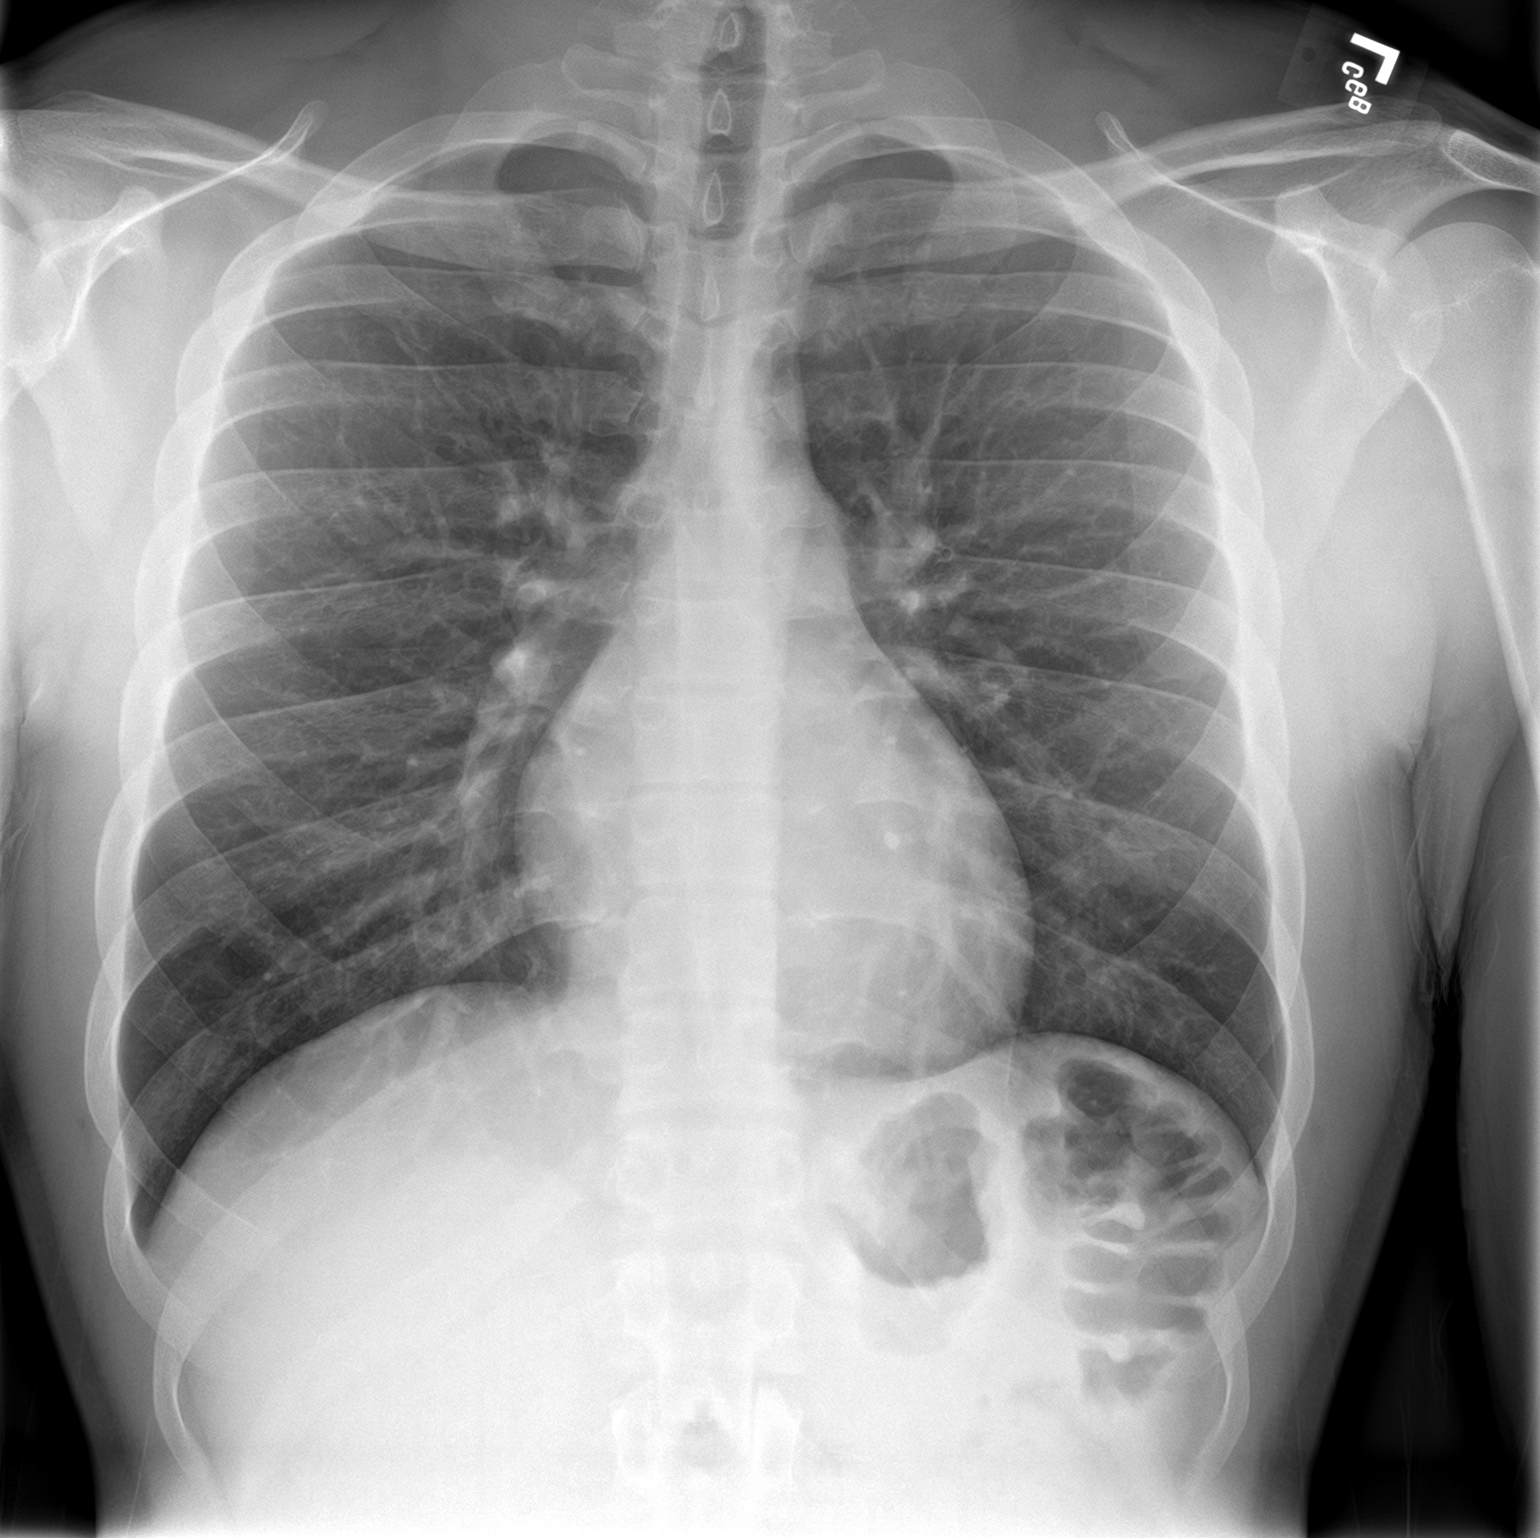

[chest lat]
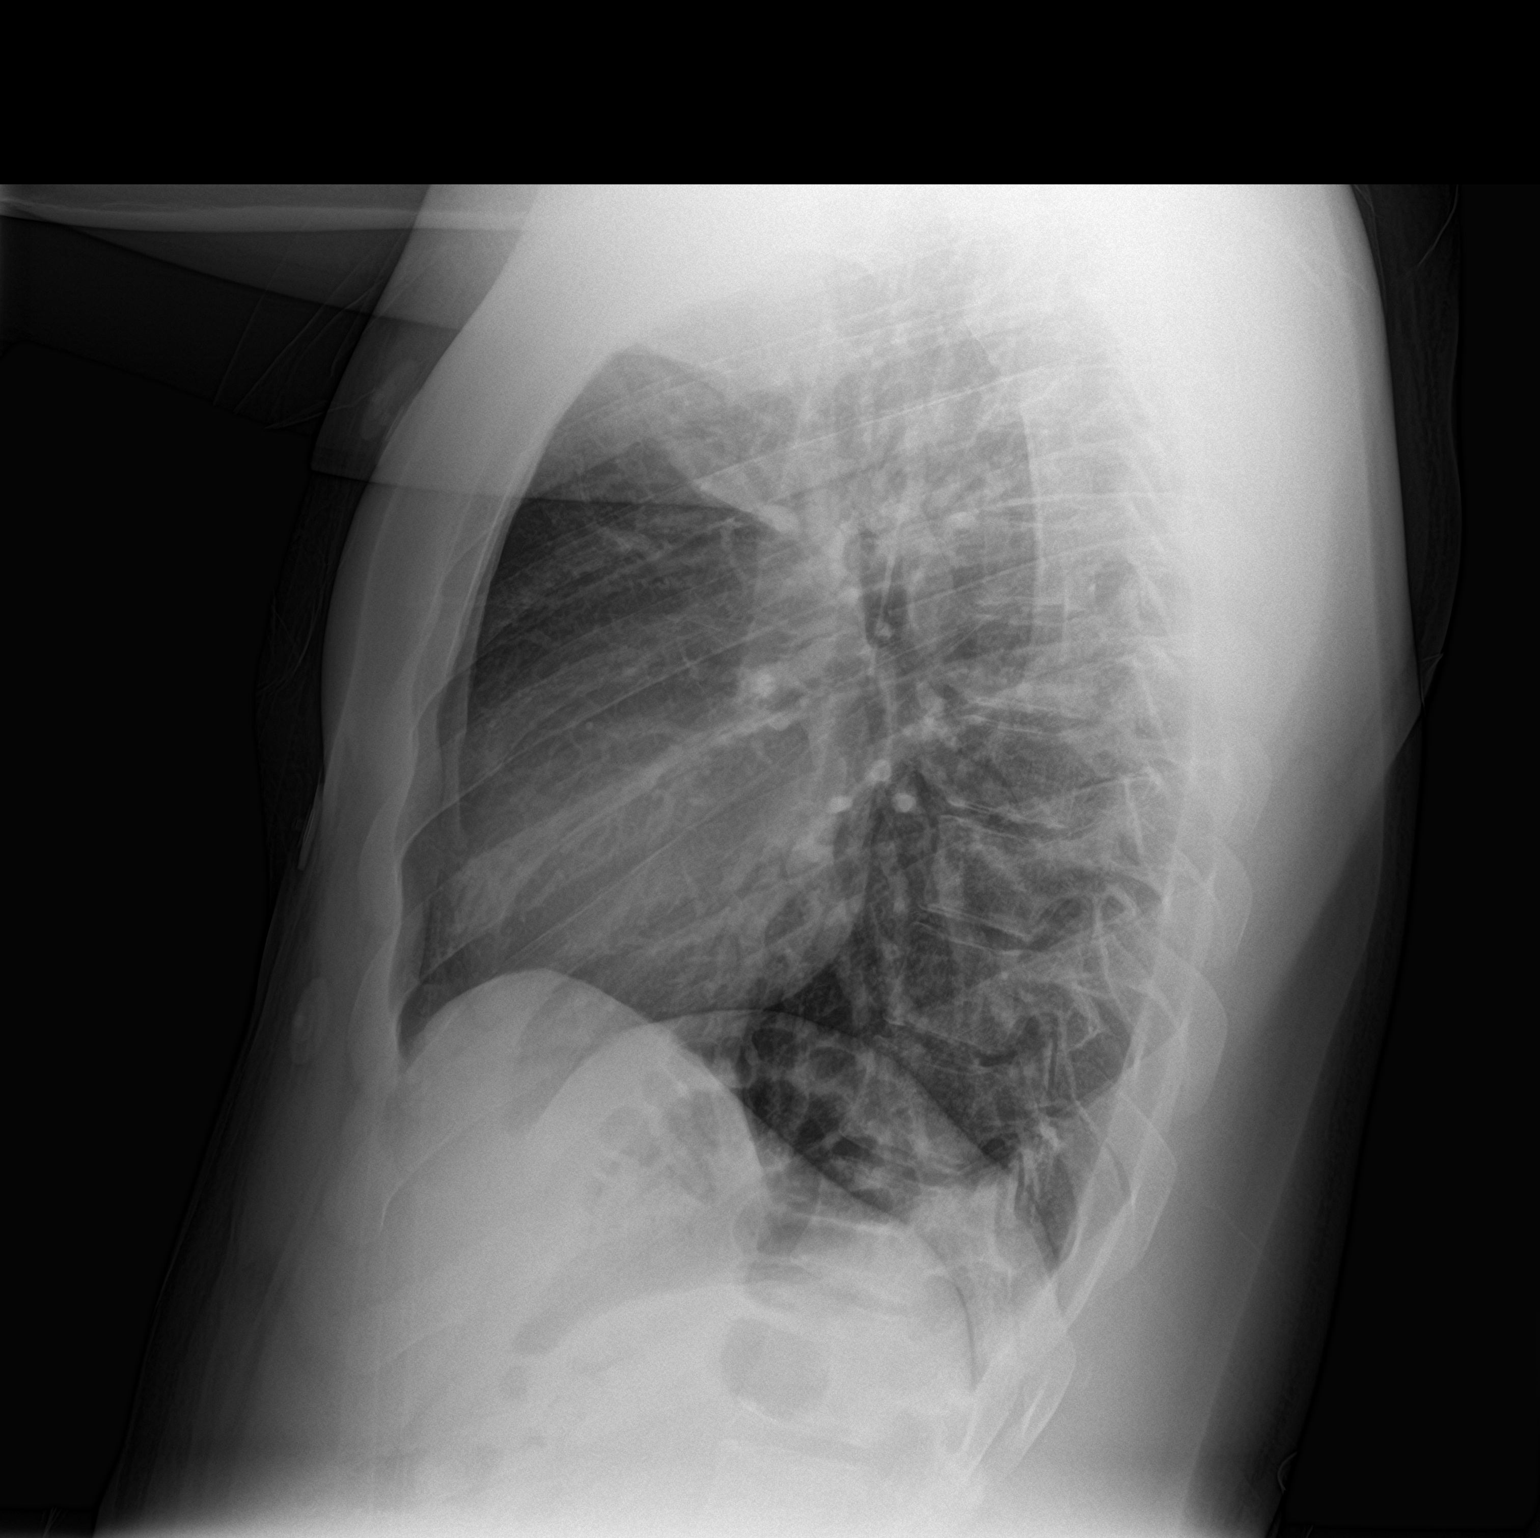

[2 of 2 positions shown; findings below may reference images not displayed]

FINDINGS: The heart size and mediastinal contours are within normal limits.
Both lungs are clear. The visualized skeletal structures are
unremarkable.
IMPRESSION: No active cardiopulmonary disease.

## 2022-09-22 ENCOUNTER — Ambulatory Visit: Payer: BC Managed Care – PPO | Admitting: Family Medicine
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
